# Patient Record
Sex: Male | Born: 1960
Health system: Southern US, Community
[De-identification: ages and names within clinical notes are randomized; demographics above are authoritative.]

## PROBLEM LIST (undated history)

## (undated) DIAGNOSIS — R51 Headache: Secondary | ICD-10-CM

## (undated) DIAGNOSIS — K219 Gastro-esophageal reflux disease without esophagitis: Secondary | ICD-10-CM

## (undated) DIAGNOSIS — Z8489 Family history of other specified conditions: Secondary | ICD-10-CM

## (undated) DIAGNOSIS — N200 Calculus of kidney: Secondary | ICD-10-CM

## (undated) HISTORY — PX: NO PAST SURGERIES: SHX2092

---

## 2000-05-27 ENCOUNTER — Emergency Department (HOSPITAL_COMMUNITY): Admission: EM | Admit: 2000-05-27 | Discharge: 2000-05-27 | Payer: Self-pay | Admitting: *Deleted

## 2001-01-16 ENCOUNTER — Encounter: Payer: Self-pay | Admitting: Emergency Medicine

## 2001-01-16 ENCOUNTER — Emergency Department (HOSPITAL_COMMUNITY): Admission: EM | Admit: 2001-01-16 | Discharge: 2001-01-16 | Payer: Self-pay | Admitting: Emergency Medicine

## 2001-02-17 ENCOUNTER — Encounter: Payer: Self-pay | Admitting: Emergency Medicine

## 2001-02-17 ENCOUNTER — Emergency Department (HOSPITAL_COMMUNITY): Admission: EM | Admit: 2001-02-17 | Discharge: 2001-02-17 | Payer: Self-pay | Admitting: Emergency Medicine

## 2001-03-21 ENCOUNTER — Encounter: Payer: Self-pay | Admitting: Emergency Medicine

## 2001-03-21 ENCOUNTER — Emergency Department (HOSPITAL_COMMUNITY): Admission: EM | Admit: 2001-03-21 | Discharge: 2001-03-21 | Payer: Self-pay | Admitting: Emergency Medicine

## 2002-09-28 ENCOUNTER — Emergency Department (HOSPITAL_COMMUNITY): Admission: EM | Admit: 2002-09-28 | Discharge: 2002-09-28 | Payer: Self-pay | Admitting: Emergency Medicine

## 2003-07-31 ENCOUNTER — Encounter: Payer: Self-pay | Admitting: Emergency Medicine

## 2003-07-31 ENCOUNTER — Emergency Department (HOSPITAL_COMMUNITY): Admission: EM | Admit: 2003-07-31 | Discharge: 2003-07-31 | Payer: Self-pay | Admitting: Emergency Medicine

## 2008-12-15 ENCOUNTER — Emergency Department (HOSPITAL_COMMUNITY): Admission: EM | Admit: 2008-12-15 | Discharge: 2008-12-15 | Payer: Self-pay | Admitting: Emergency Medicine

## 2009-06-28 ENCOUNTER — Emergency Department (HOSPITAL_COMMUNITY): Admission: EM | Admit: 2009-06-28 | Discharge: 2009-06-28 | Payer: Self-pay | Admitting: Emergency Medicine

## 2011-03-31 LAB — HEPATIC FUNCTION PANEL
AST: 25 U/L (ref 0–37)
Bilirubin, Direct: <0.1 mg/dL (ref 0.0–0.3)

## 2011-03-31 LAB — DIFFERENTIAL
Basophils Absolute: 0 10*3/uL (ref 0.0–0.1)
Basophils Relative: 0 % (ref 0–1)
Monocytes Absolute: 0.6 10*3/uL (ref 0.1–1.0)
Neutro Abs: 3.2 10*3/uL (ref 1.7–7.7)

## 2011-03-31 LAB — CBC
Hemoglobin: 14.8 g/dL (ref 13.0–17.0)
MCHC: 34.3 g/dL (ref 30.0–36.0)
RDW: 14.5 % (ref 11.5–15.5)

## 2011-03-31 LAB — URINALYSIS, ROUTINE W REFLEX MICROSCOPIC
Glucose, UA: NEGATIVE mg/dL
Leukocytes, UA: NEGATIVE
Nitrite: NEGATIVE
Protein, ur: NEGATIVE mg/dL
Urobilinogen, UA: 1 mg/dL (ref 0.0–1.0)

## 2011-03-31 LAB — POCT I-STAT, CHEM 8
Calcium, Ion: 1.09 mmol/L — ABNORMAL LOW (ref 1.12–1.32)
Glucose, Bld: 116 mg/dL — ABNORMAL HIGH (ref 70–99)
HCT: 46 % (ref 39.0–52.0)
Hemoglobin: 15.6 g/dL (ref 13.0–17.0)
TCO2: 25 mmol/L (ref 0–100)

## 2011-03-31 LAB — URINE MICROSCOPIC-ADD ON

## 2011-09-26 LAB — DIFFERENTIAL
Eosinophils Absolute: 0 10*3/uL (ref 0.0–0.7)
Eosinophils Relative: 0 % (ref 0–5)
Lymphocytes Relative: 12 % (ref 12–46)
Lymphs Abs: 1.2 10*3/uL (ref 0.7–4.0)
Monocytes Relative: 2 % — ABNORMAL LOW (ref 3–12)
Neutrophils Relative %: 85 % — ABNORMAL HIGH (ref 43–77)

## 2011-09-26 LAB — COMPREHENSIVE METABOLIC PANEL
ALT: 26 U/L (ref 0–53)
AST: 31 U/L (ref 0–37)
Calcium: 9.5 mg/dL (ref 8.4–10.5)
GFR calc Af Amer: >60 mL/min (ref >60)
Sodium: 141 mEq/L (ref 135–145)
Total Protein: 7.9 g/dL (ref 6.0–8.3)

## 2011-09-26 LAB — URINE MICROSCOPIC-ADD ON

## 2011-09-26 LAB — URINALYSIS, ROUTINE W REFLEX MICROSCOPIC
Glucose, UA: NEGATIVE mg/dL
Ketones, ur: 40 mg/dL — AB
Leukocytes, UA: NEGATIVE
Specific Gravity, Urine: 1.023 (ref 1.005–1.030)
pH: 8.5 — ABNORMAL HIGH (ref 5.0–8.0)

## 2011-09-26 LAB — CBC
MCHC: 33.2 g/dL (ref 30.0–36.0)
RDW: 13 % (ref 11.5–15.5)

## 2012-04-07 ENCOUNTER — Encounter (HOSPITAL_COMMUNITY): Payer: Self-pay | Admitting: Emergency Medicine

## 2012-04-07 ENCOUNTER — Emergency Department (HOSPITAL_COMMUNITY)
Admission: EM | Admit: 2012-04-07 | Discharge: 2012-04-08 | Disposition: A | Discharge status: Home / Self Care | Payer: Self-pay | Attending: Emergency Medicine | Admitting: Emergency Medicine

## 2012-04-07 DIAGNOSIS — E876 Hypokalemia: Secondary | ICD-10-CM | POA: Insufficient documentation

## 2012-04-07 DIAGNOSIS — R112 Nausea with vomiting, unspecified: Secondary | ICD-10-CM | POA: Insufficient documentation

## 2012-04-07 DIAGNOSIS — K429 Umbilical hernia without obstruction or gangrene: Secondary | ICD-10-CM | POA: Insufficient documentation

## 2012-04-07 DIAGNOSIS — R197 Diarrhea, unspecified: Secondary | ICD-10-CM | POA: Insufficient documentation

## 2012-04-07 NOTE — ED Notes (Signed)
PT. REPORTS EMESIS WITH DIARRHEA AND MID ABDOMINAL PAIN ONSET THIS EVENING ,.

## 2012-04-08 ENCOUNTER — Emergency Department (HOSPITAL_COMMUNITY): Payer: Self-pay

## 2012-04-08 LAB — POCT I-STAT, CHEM 8
Chloride: 113 mEq/L — ABNORMAL HIGH (ref 96–112)
Creatinine, Ser: 1.1 mg/dL (ref 0.50–1.35)
Glucose, Bld: 139 mg/dL — ABNORMAL HIGH (ref 70–99)
HCT: 41 % (ref 39.0–52.0)
Hemoglobin: 13.9 g/dL (ref 13.0–17.0)
Potassium: 2.8 mEq/L — ABNORMAL LOW (ref 3.5–5.1)
Sodium: 152 mEq/L — ABNORMAL HIGH (ref 135–145)

## 2012-04-08 LAB — CBC
Hemoglobin: 13.6 g/dL (ref 13.0–17.0)
MCH: 31.3 pg (ref 26.0–34.0)
MCHC: 34.3 g/dL (ref 30.0–36.0)
Platelets: 208 10*3/uL (ref 150–400)
RDW: 13.1 % (ref 11.5–15.5)

## 2012-04-08 LAB — URINALYSIS, ROUTINE W REFLEX MICROSCOPIC
Bilirubin Urine: NEGATIVE
Specific Gravity, Urine: 1.042 — ABNORMAL HIGH (ref 1.005–1.030)
Urobilinogen, UA: 1 mg/dL (ref 0.0–1.0)
pH: 7.5 (ref 5.0–8.0)

## 2012-04-08 LAB — COMPREHENSIVE METABOLIC PANEL
ALT: 24 U/L (ref 0–53)
AST: 39 U/L — ABNORMAL HIGH (ref 0–37)
Albumin: 4.4 g/dL (ref 3.5–5.2)
Alkaline Phosphatase: 71 U/L (ref 39–117)
Calcium: 9.9 mg/dL (ref 8.4–10.5)
Potassium: 2.8 mEq/L — ABNORMAL LOW (ref 3.5–5.1)
Sodium: 148 mEq/L — ABNORMAL HIGH (ref 135–145)
Total Protein: 7.5 g/dL (ref 6.0–8.3)

## 2012-04-08 LAB — URINE MICROSCOPIC-ADD ON

## 2012-04-08 MED ORDER — ONDANSETRON HCL 4 MG/2ML IJ SOLN
INTRAMUSCULAR | Status: AC
  Administered 2012-04-08: 4 mg via INTRAVENOUS
  Filled 2012-04-08: qty 2

## 2012-04-08 MED ORDER — HYDROCODONE-ACETAMINOPHEN 5-500 MG PO TABS: 1.0000 | ORAL_TABLET | Freq: Four times a day (QID) | ORAL | Status: AC | PRN

## 2012-04-08 MED ORDER — POTASSIUM CHLORIDE CRYS ER 20 MEQ PO TBCR
40.0000 meq | EXTENDED_RELEASE_TABLET | Freq: Once | ORAL | Status: AC
  Administered 2012-04-08: 40 meq via ORAL
  Filled 2012-04-08: qty 2

## 2012-04-08 MED ORDER — HYDROMORPHONE HCL PF 1 MG/ML IJ SOLN
1.0000 mg | Freq: Once | INTRAMUSCULAR | Status: AC
  Administered 2012-04-08: 1 mg via INTRAVENOUS
  Filled 2012-04-08: qty 1

## 2012-04-08 MED ORDER — FAMOTIDINE 20 MG PO TABS
20.0000 mg | ORAL_TABLET | Freq: Once | ORAL | Status: AC
  Administered 2012-04-08: 20 mg via ORAL
  Filled 2012-04-08: qty 1

## 2012-04-08 MED ORDER — POTASSIUM CHLORIDE 10 MEQ/100ML IV SOLN
10.0000 meq | Freq: Once | INTRAVENOUS | Status: AC
  Administered 2012-04-08: 10 meq via INTRAVENOUS
  Filled 2012-04-08: qty 100

## 2012-04-08 MED ORDER — IOHEXOL 300 MG/ML  SOLN
100.0000 mL | Freq: Once | INTRAMUSCULAR | Status: AC | PRN
  Administered 2012-04-08: 100 mL via INTRAVENOUS

## 2012-04-08 MED ORDER — SODIUM CHLORIDE 0.9 % IV BOLUS (SEPSIS)
1000.0000 mL | Freq: Once | INTRAVENOUS | Status: AC
  Administered 2012-04-08: 1000 mL via INTRAVENOUS

## 2012-04-08 MED ORDER — ONDANSETRON HCL 4 MG/2ML IJ SOLN
4.0000 mg | Freq: Once | INTRAMUSCULAR | Status: AC
  Administered 2012-04-08: 4 mg via INTRAVENOUS
  Filled 2012-04-08: qty 2

## 2012-04-08 MED ORDER — PANTOPRAZOLE SODIUM 40 MG PO TBEC
40.0000 mg | DELAYED_RELEASE_TABLET | Freq: Once | ORAL | Status: AC
  Administered 2012-04-08: 40 mg via ORAL
  Filled 2012-04-08: qty 1

## 2012-04-08 MED ORDER — FAMOTIDINE 20 MG PO TABS: 20.0000 mg | ORAL_TABLET | Freq: Two times a day (BID) | ORAL | Status: DC

## 2012-04-08 MED ORDER — ONDANSETRON HCL 4 MG PO TABS: 4.0000 mg | ORAL_TABLET | Freq: Four times a day (QID) | ORAL | Status: AC

## 2012-04-08 MED ORDER — MORPHINE SULFATE 4 MG/ML IJ SOLN
4.0000 mg | Freq: Once | INTRAMUSCULAR | Status: AC
  Administered 2012-04-08: 4 mg via INTRAVENOUS
  Filled 2012-04-08: qty 1

## 2012-04-08 MED ORDER — HYDROMORPHONE HCL PF 1 MG/ML IJ SOLN
1.0000 mg | Freq: Once | INTRAMUSCULAR | Status: DC
  Filled 2012-04-08: qty 1

## 2012-04-08 MED ORDER — IOHEXOL 300 MG/ML  SOLN
20.0000 mL | INTRAMUSCULAR | Status: AC
  Administered 2012-04-08 (×2): 20 mL via ORAL

## 2012-04-08 NOTE — ED Notes (Signed)
Patient with nausea, vomiting and diarrhea since yesterday.  Patient states he also having abdominal pain.  Patient unable to sit still on bed due to pain.  Patient is CAOx3, mother at bedside.

## 2012-04-08 NOTE — Discharge Instructions (Signed)
Abdominal Pain Abdominal pain can be caused by many things. Your caregiver decides the seriousness of your pain by an examination and possibly blood tests and X-rays. Many cases can be observed and treated at home. Most abdominal pain is not caused by a disease and will probably improve without treatment. However, in many cases, more time must pass before a clear cause of the pain can be found. Before that point, it may not be known if you need more testing, or if hospitalization or surgery is needed. HOME CARE INSTRUCTIONS   Do not take laxatives unless directed by your caregiver.   Take pain medicine only as directed by your caregiver.   Only take over-the-counter or prescription medicines for pain, discomfort, or fever as directed by your caregiver.   Try a clear liquid diet (broth, tea, or water) for as long as directed by your caregiver. Slowly move to a bland diet as tolerated.  SEEK IMMEDIATE MEDICAL CARE IF:   The pain does not go away.   You have a fever.   You keep throwing up (vomiting).   The pain is felt only in portions of the abdomen. Pain in the right side could possibly be appendicitis. In an adult, pain in the left lower portion of the abdomen could be colitis or diverticulitis.   You pass bloody or black tarry stools.  MAKE SURE YOU:   Understand these instructions.   Will watch your condition.   

## 2012-04-08 NOTE — ED Provider Notes (Signed)
History     CSN: [id number]  Arrival date & time 04/07/12  2047   First MD Initiated Contact with Patient 04/08/12 0011      Chief Complaint  Patient presents with  . Emesis    (Consider location/radiation/quality/duration/timing/severity/associated sxs/prior treatment) Patient is a 51 y.o. male presenting with vomiting. The history is provided by the patient.  Emesis  This is a new problem. Associated symptoms include abdominal pain and diarrhea. Pertinent negatives include no chills, no fever and no headaches.   onset today of mid abdominal pain with associated nausea vomiting and diarrhea. Pain is sharp in quality and severe. Pain is not radiating. No right-sided abdominal pain and does not seem to be associated food. Patient states history of gallstones and does not feel anything like that. No fevers or chills. No known sick contacts. Patient works in Aeronautical [employer] and denies any known exposures or bad food exposures. No blood in stool or emesis. No history of peptic ulcer disease. No known aggravating or alleviating factors. Patient unable to hold still due to severe pains. No hematuria. No dysuria or difficulty urinating. No recent travel or antibiotics.  History reviewed. No pertinent past medical history.  History reviewed. No pertinent past surgical history.  No family history on file.  History  Substance Use Topics  . Smoking status: Never Smoker   . Smokeless tobacco: Not on file  . Alcohol Use: No      Review of Systems  Constitutional: Negative for fever and chills.  HENT: Negative for neck pain and neck stiffness.   Eyes: Negative for pain.  Respiratory: Negative for shortness of breath.   Cardiovascular: Negative for chest pain.  Gastrointestinal: Positive for nausea, vomiting, abdominal pain and diarrhea.  Genitourinary: Negative for dysuria.  Musculoskeletal: Negative for back pain.  Skin: Negative for rash.  Neurological: Negative for headaches.  All  other systems reviewed and are negative.    Allergies  Review of patient's allergies indicates no known allergies.  Home Medications   Current Outpatient Rx  Name Route Sig Dispense Refill  . FAMOTIDINE 40 MG PO TABS Oral Take 40 mg by mouth daily.    Marland Kitchen FAMOTIDINE 20 MG PO TABS Oral Take 1 tablet (20 mg total) by mouth 2 (two) times daily. 30 tablet 0  . HYDROCODONE-ACETAMINOPHEN 5-500 MG PO TABS Oral Take 1-2 tablets by mouth every 6 (six) hours as needed for pain. 15 tablet 0  . ONDANSETRON HCL 4 MG PO TABS Oral Take 1 tablet (4 mg total) by mouth every 6 (six) hours. 12 tablet 0    BP 117/69  Pulse 81  Temp(Src) 98.1 F (36.7 C) (Oral)  Resp 20  SpO2 97%  Physical Exam  Constitutional: He is oriented to person, place, and time. He appears well-developed and well-nourished.  HENT:  Head: Normocephalic and atraumatic.  Eyes: Conjunctivae and EOM are normal. Pupils are equal, round, and reactive to light.  Neck: Trachea normal. Neck supple. No thyromegaly present.  Cardiovascular: Normal rate, regular rhythm, S1 normal, S2 normal and normal pulses.     No systolic murmur is present   No diastolic murmur is present  Pulses:      Radial pulses are 2+ on the right side, and 2+ on the left side.  Pulmonary/Chest: Effort normal and breath sounds normal. He has no wheezes. He has no rhonchi. He has no rales. He exhibits no tenderness.  Abdominal: Soft. Normal appearance and bowel sounds are normal. He exhibits no distension. There  is no rebound, no guarding, no CVA tenderness and negative Murphy's sign.       Tender epigastric and midabdomen. No ecchymosis or discoloration  Musculoskeletal:       BLE:s Calves nontender, no cords or erythema, negative Homans sign  Neurological: He is alert and oriented to person, place, and time. He has normal strength. No cranial nerve deficit or sensory deficit. GCS eye subscore is 4. GCS verbal subscore is 5. GCS motor subscore is 6.  Skin: Skin  is warm and dry. No rash noted. He is not diaphoretic.  Psychiatric: His speech is normal.       Cooperative and appropriate    ED Course  Procedures (including critical care time)  Labs Reviewed  CBC - Abnormal; Notable for the following:    WBC 11.1 (*)    All other components within normal limits  COMPREHENSIVE METABOLIC PANEL - Abnormal; Notable for the following:    Sodium 148 (*)    Potassium 2.8 (*)    Glucose, Bld 130 (*)    AST 39 (*)    GFR calc non Af Amer 81 (*)    All other components within normal limits  URINALYSIS, ROUTINE W REFLEX MICROSCOPIC - Abnormal; Notable for the following:    Specific Gravity, Urine 1.042 (*)    Hgb urine dipstick TRACE (*)    Ketones, ur >80 (*)    All other components within normal limits  POCT I-STAT, CHEM 8 - Abnormal; Notable for the following:    Sodium 152 (*)    Potassium 2.8 (*)    Chloride 113 (*)    Glucose, Bld 139 (*)    All other components within normal limits  URINE MICROSCOPIC-ADD ON - Abnormal; Notable for the following:    Squamous Epithelial / LPF FEW (*)    Casts HYALINE CASTS (*)    All other components within normal limits  LIPASE, BLOOD  STOOL CULTURE   Ct Abdomen Pelvis W Contrast  04/08/2012  *RADIOLOGY REPORT*  Clinical Data: Mid to lower abdominal pain, nausea, vomiting and diarrhea.  CT ABDOMEN AND PELVIS WITH CONTRAST  Technique:  Multidetector CT imaging of the abdomen and pelvis was performed following the standard protocol during bolus administration of intravenous contrast.  Contrast: OMNIPAQUE IOHEXOL 300 MG/ML  SOLN  Comparison: CT of the abdomen and pelvis performed 06/28/2009  Findings: Minimal right basilar atelectasis is noted.  A small hypoattenuating lesion within the posterior hepatic dome has decreased mildly in size from 2010, and is likely benign.  A few tiny hypodensities within the liver likely reflect a tiny cysts, though not well characterized.  The liver is otherwise unremarkable  in appearance.  The gallbladder is within normal limits.  The pancreas and adrenal glands are unremarkable.  A small 0.7 cm hypodensity within the interpole region of the right kidney likely reflects a small cyst.  The kidneys are otherwise unremarkable in appearance.  There is no evidence of hydronephrosis.  No renal or ureteral stones are seen.  No perinephric stranding is appreciated.  No free fluid is identified.  The small bowel is unremarkable in appearance.  The stomach is within normal limits.  No acute vascular abnormalities are seen.  A small umbilical hernia is noted, containing only fat.  The appendix is normal in caliber and contains air, extending into the right hemipelvis, without evidence for appendicitis.  The colon is unremarkable in appearance.  The bladder is mildly distended and grossly unremarkable in appearance.  The prostate remains normal in size.  No inguinal lymphadenopathy is seen.  No acute osseous abnormalities are identified.  IMPRESSION:  1.  No acute abnormalities seen within the abdomen or pelvis. 2.  Tiny renal and hepatic cysts noted; mildly hyperattenuating focus within the right hepatic lobe has decreased in size since 2010, and is likely benign. 3.  Small umbilical hernia, containing only fat.  Original Report Authenticated By: Tonia Ghent, M.D.     1. Abdominal pain   2. Nausea vomiting and diarrhea   3. Hypokalemia    IV fluids. IV Dilaudid. IV Zofran. Serial evaluations and repeat medications. CT scan ordered for severe pain and reviewed as above. Labs obtained and reviewed as above. Potassium provide.  Recheck at 4:45 AM is feeling completely better. Nausea resolved and no vomiting in the ED. No further diarrhea. On exam abdomen is soft nontender. Patient has had some mild heartburn and Pepcid Protonix provided.  MDM   Abdominal pain with nausea vomiting and diarrhea likely gastroenteritis. GI referral as needed. Reliable historian and verbalizes  understanding all discharge and followup instructions and agrees to abdominal pain precautions. Prescription for Zofran Pepcid and hydrocodone provided as needed       Sunnie Nielsen, MD 04/08/12 [phone]

## 2012-04-08 NOTE — ED Notes (Signed)
Patient continues with nausea and pain.  MD notified.

## 2012-12-28 ENCOUNTER — Encounter (HOSPITAL_COMMUNITY): Payer: Self-pay | Admitting: *Deleted

## 2012-12-28 ENCOUNTER — Emergency Department (HOSPITAL_COMMUNITY)
Admission: EM | Admit: 2012-12-28 | Discharge: 2012-12-29 | Disposition: A | Discharge status: Home / Self Care | Payer: Self-pay | Attending: Emergency Medicine | Admitting: Emergency Medicine

## 2012-12-28 DIAGNOSIS — R112 Nausea with vomiting, unspecified: Secondary | ICD-10-CM | POA: Insufficient documentation

## 2012-12-28 DIAGNOSIS — R197 Diarrhea, unspecified: Secondary | ICD-10-CM | POA: Insufficient documentation

## 2012-12-28 DIAGNOSIS — R109 Unspecified abdominal pain: Secondary | ICD-10-CM | POA: Insufficient documentation

## 2012-12-28 DIAGNOSIS — Z79899 Other long term (current) drug therapy: Secondary | ICD-10-CM | POA: Insufficient documentation

## 2012-12-28 LAB — CBC WITH DIFFERENTIAL/PLATELET
Basophils Relative: 0 % (ref 0–1)
Basophils Relative: 0 % (ref 0–1)
Eosinophils Absolute: 0.1 10*3/uL (ref 0.0–0.7)
HCT: 43.6 % (ref 39.0–52.0)
Hemoglobin: 15.3 g/dL (ref 13.0–17.0)
Lymphs Abs: 1 10*3/uL (ref 0.7–4.0)
MCH: 31.5 pg (ref 26.0–34.0)
MCHC: 34.9 g/dL (ref 30.0–36.0)
MCHC: 35.1 g/dL (ref 30.0–36.0)
Monocytes Absolute: 0.8 10*3/uL (ref 0.1–1.0)
Monocytes Relative: 5 % (ref 3–12)
Neutro Abs: 13.3 10*3/uL — ABNORMAL HIGH (ref 1.7–7.7)
Neutrophils Relative %: 76 % (ref 43–77)
Platelets: 230 10*3/uL (ref 150–400)
RBC: 4.88 MIL/uL (ref 4.22–5.81)

## 2012-12-28 LAB — COMPREHENSIVE METABOLIC PANEL
ALT: 16 U/L (ref 0–53)
Albumin: 4.3 g/dL (ref 3.5–5.2)
Albumin: 4.1 g/dL (ref 3.5–5.2)
Alkaline Phosphatase: 64 U/L (ref 39–117)
Alkaline Phosphatase: 59 U/L (ref 39–117)
BUN: 12 mg/dL (ref 6–23)
BUN: 13 mg/dL (ref 6–23)
CO2: 21 mEq/L (ref 19–32)
Calcium: 9.9 mg/dL (ref 8.4–10.5)
Chloride: 106 mEq/L (ref 96–112)
Creatinine, Ser: 0.98 mg/dL (ref 0.50–1.35)
GFR calc Af Amer: >90 mL/min (ref >90)
GFR calc non Af Amer: >90 mL/min (ref >90)
Glucose, Bld: 132 mg/dL — ABNORMAL HIGH (ref 70–99)
Potassium: 3.4 mEq/L — ABNORMAL LOW (ref 3.5–5.1)
Potassium: 3.3 mEq/L — ABNORMAL LOW (ref 3.5–5.1)
Sodium: 144 mEq/L (ref 135–145)
Total Bilirubin: 0.7 mg/dL (ref 0.3–1.2)
Total Protein: 8 g/dL (ref 6.0–8.3)

## 2012-12-28 LAB — LIPASE, BLOOD: Lipase: 25 U/L (ref 11–59)

## 2012-12-28 MED ORDER — ONDANSETRON 4 MG PO TBDP
8.0000 mg | ORAL_TABLET | Freq: Once | ORAL | Status: AC
  Administered 2012-12-28: 8 mg via ORAL
  Filled 2012-12-28: qty 2

## 2012-12-28 MED ORDER — MORPHINE SULFATE 4 MG/ML IJ SOLN
6.0000 mg | Freq: Once | INTRAMUSCULAR | Status: AC
  Administered 2012-12-28: 6 mg via INTRAVENOUS
  Filled 2012-12-28: qty 2

## 2012-12-28 MED ORDER — ONDANSETRON HCL 4 MG/2ML IJ SOLN
4.0000 mg | Freq: Once | INTRAMUSCULAR | Status: AC
  Administered 2012-12-28: 4 mg via INTRAVENOUS
  Filled 2012-12-28: qty 2

## 2012-12-28 MED ORDER — SODIUM CHLORIDE 0.9 % IV BOLUS (SEPSIS)
1000.0000 mL | Freq: Once | INTRAVENOUS | Status: AC
  Administered 2012-12-28: 1000 mL via INTRAVENOUS

## 2012-12-28 MED ORDER — FAMOTIDINE IN NACL 20-0.9 MG/50ML-% IV SOLN
20.0000 mg | Freq: Once | INTRAVENOUS | Status: AC
  Administered 2012-12-28: 20 mg via INTRAVENOUS
  Filled 2012-12-28: qty 50

## 2012-12-28 NOTE — ED Notes (Signed)
Patient has been vomiting gastric fluids since arrival to room. He has 10/10 abdominal pain. States symptoms were sudden onset around 6pm and he has had diarrhea as well.

## 2012-12-28 NOTE — ED Notes (Signed)
Pt states that he started vomiting 2 hours ago. Pt states that he has vomited 10 times in the last hour and that he has not eaten anything today. Pt states that he is also having abdominal pain as well. Pt states generalized and sharp. Pt denies diarrhea. Pt moaning, unable to sit still in triage. Pt vomited once in triage.

## 2012-12-28 NOTE — ED Provider Notes (Signed)
History     CSN: [id number]  Arrival date & time 12/28/12  9147   First MD Initiated Contact with Patient 12/28/12 2204      Chief Complaint  Patient presents with  . Emesis    (Consider location/radiation/quality/duration/timing/severity/associated sxs/prior treatment) The history is provided by the patient.    Noah Avila is a 52 y.o. male with acute onset of nausea, vomiting, diarrhea and abdominal pain earlier today. Patient denies fever, change in urination.   History reviewed. No pertinent past medical history.  History reviewed. No pertinent past surgical history.  History reviewed. No pertinent family history.  History  Substance Use Topics  . Smoking status: Never Smoker   . Smokeless tobacco: Not on file  . Alcohol Use: No      Review of Systems  Constitutional: Negative for fever.  Respiratory: Negative for shortness of breath.   Cardiovascular: Negative for chest pain.  Gastrointestinal: Positive for nausea, vomiting and abdominal pain. Negative for diarrhea.  All other systems reviewed and are negative.    Allergies  Review of patient's allergies indicates no known allergies.  Home Medications   Current Outpatient Rx  Name  Route  Sig  Dispense  Refill  . FAMOTIDINE 40 MG PO TABS   Oral   Take 40 mg by mouth daily.           BP 141/84  Pulse 78  Temp 97.5 F (36.4 C) (Oral)  Resp 16  SpO2 100%  Physical Exam  Nursing note and vitals reviewed. Constitutional: He is oriented to person, place, and time. He appears well-developed and well-nourished. No distress.       Patient is very dramatic  HENT:  Head: Normocephalic.  Mouth/Throat: Oropharynx is clear and moist.  Eyes: Conjunctivae normal and EOM are normal. Pupils are equal, round, and reactive to light.  Cardiovascular: Normal rate.   Pulmonary/Chest: Effort normal and breath sounds normal. No stridor. No respiratory distress. He has no wheezes. He has no rales. He exhibits  no tenderness.  Abdominal: Soft. Bowel sounds are normal. He exhibits no distension and no mass. There is no tenderness. There is no rebound and no guarding.  Genitourinary:       No CVA tenderness bilaterally  Musculoskeletal: Normal range of motion.  Neurological: He is alert and oriented to person, place, and time.  Psychiatric: He has a normal mood and affect.    ED Course  Procedures (including critical care time)  Labs Reviewed  CBC WITH DIFFERENTIAL - Abnormal; Notable for the following:    WBC 13.5 (*)     Neutro Abs 10.2 (*)     All other components within normal limits  COMPREHENSIVE METABOLIC PANEL - Abnormal; Notable for the following:    Potassium 3.4 (*)     Glucose, Bld 146 (*)     All other components within normal limits  CBC WITH DIFFERENTIAL - Abnormal; Notable for the following:    WBC 15.1 (*)     Neutrophils Relative 88 (*)     Neutro Abs 13.3 (*)     Lymphocytes Relative 7 (*)     All other components within normal limits  COMPREHENSIVE METABOLIC PANEL - Abnormal; Notable for the following:    Potassium 3.3 (*)     Glucose, Bld 132 (*)     All other components within normal limits  LIPASE, BLOOD  URINALYSIS, ROUTINE W REFLEX MICROSCOPIC   No results found.   1. Nausea vomiting and diarrhea  MDM  Likely gastroenteritis. After initial assessment patient calmed down quite a bit and abdominal exam is benign with no tenderness to palpation or peritoneal signs.  Is tolerating by mouth, vital signs are stable and he is amenable to discharge at this time.   Pt verbalized understanding and agrees with care plan. Outpatient follow-up and return precautions given.    New Prescriptions   ONDANSETRON (ZOFRAN) 4 MG TABLET    Take 1 tablet (4 mg total) by mouth every 6 (six) hours.          Wynetta Emery, PA-C 12/29/12 [phone]

## 2012-12-29 LAB — URINALYSIS, ROUTINE W REFLEX MICROSCOPIC
Ketones, ur: 15 mg/dL — AB
Leukocytes, UA: NEGATIVE
Nitrite: NEGATIVE
Protein, ur: NEGATIVE mg/dL
Urobilinogen, UA: 0.2 mg/dL (ref 0.0–1.0)

## 2012-12-29 LAB — URINE MICROSCOPIC-ADD ON

## 2012-12-29 MED ORDER — ONDANSETRON HCL 4 MG PO TABS: 4.0000 mg | ORAL_TABLET | Freq: Four times a day (QID) | ORAL | Status: DC

## 2012-12-29 MED ORDER — ONDANSETRON HCL 4 MG/2ML IJ SOLN
4.0000 mg | Freq: Once | INTRAMUSCULAR | Status: AC
  Administered 2012-12-29: 4 mg via INTRAVENOUS
  Filled 2012-12-29: qty 2

## 2012-12-29 NOTE — ED Notes (Signed)
Patient states he started feeling sick again and vomited once.

## 2012-12-29 NOTE — ED Notes (Signed)
Pt reports taking small sips of drink without any nausea at this time.

## 2012-12-29 NOTE — ED Notes (Signed)
Patient holding down fluids at this time. No other complaints.

## 2012-12-29 NOTE — ED Notes (Signed)
Patient given something to drink.

## 2012-12-30 NOTE — ED Provider Notes (Signed)
Medical screening examination/treatment/procedure(s) were performed by non-physician practitioner and as supervising physician I was immediately available for consultation/collaboration.   Laray Anger, DO 12/30/12 1013

## 2014-07-26 ENCOUNTER — Observation Stay (HOSPITAL_COMMUNITY)
Admission: EM | Admit: 2014-07-26 | Discharge: 2014-07-27 | Disposition: A | Discharge status: Home / Self Care | Payer: No Typology Code available for payment source | Attending: Internal Medicine | Admitting: Internal Medicine

## 2014-07-26 ENCOUNTER — Emergency Department (HOSPITAL_COMMUNITY): Payer: No Typology Code available for payment source

## 2014-07-26 ENCOUNTER — Encounter (HOSPITAL_COMMUNITY): Payer: Self-pay | Admitting: Emergency Medicine

## 2014-07-26 DIAGNOSIS — R55 Syncope and collapse: Principal | ICD-10-CM | POA: Diagnosis present

## 2014-07-26 DIAGNOSIS — K5289 Other specified noninfective gastroenteritis and colitis: Secondary | ICD-10-CM | POA: Insufficient documentation

## 2014-07-26 DIAGNOSIS — R6889 Other general symptoms and signs: Secondary | ICD-10-CM | POA: Diagnosis present

## 2014-07-26 DIAGNOSIS — R109 Unspecified abdominal pain: Secondary | ICD-10-CM | POA: Insufficient documentation

## 2014-07-26 DIAGNOSIS — R112 Nausea with vomiting, unspecified: Secondary | ICD-10-CM | POA: Insufficient documentation

## 2014-07-26 DIAGNOSIS — Z87442 Personal history of urinary calculi: Secondary | ICD-10-CM | POA: Insufficient documentation

## 2014-07-26 DIAGNOSIS — I441 Atrioventricular block, second degree: Secondary | ICD-10-CM | POA: Diagnosis present

## 2014-07-26 DIAGNOSIS — Z79899 Other long term (current) drug therapy: Secondary | ICD-10-CM | POA: Insufficient documentation

## 2014-07-26 DIAGNOSIS — K529 Noninfective gastroenteritis and colitis, unspecified: Secondary | ICD-10-CM | POA: Diagnosis present

## 2014-07-26 DIAGNOSIS — R197 Diarrhea, unspecified: Secondary | ICD-10-CM | POA: Insufficient documentation

## 2014-07-26 HISTORY — DX: Family history of other specified conditions: Z84.89

## 2014-07-26 HISTORY — DX: Headache: R51

## 2014-07-26 HISTORY — DX: Calculus of kidney: N20.0

## 2014-07-26 LAB — URINALYSIS, ROUTINE W REFLEX MICROSCOPIC
BILIRUBIN URINE: NEGATIVE
GLUCOSE, UA: NEGATIVE mg/dL
Ketones, ur: NEGATIVE mg/dL
Leukocytes, UA: NEGATIVE
Nitrite: NEGATIVE
Protein, ur: NEGATIVE mg/dL
SPECIFIC GRAVITY, URINE: 1.018 (ref 1.005–1.030)
UROBILINOGEN UA: 0.2 mg/dL (ref 0.0–1.0)
pH: 8 (ref 5.0–8.0)

## 2014-07-26 LAB — CBC WITH DIFFERENTIAL/PLATELET
Basophils Absolute: 0 10*3/uL (ref 0.0–0.1)
Basophils Relative: 0 % (ref 0–1)
EOS ABS: 0.2 10*3/uL (ref 0.0–0.7)
Eosinophils Relative: 2 % (ref 0–5)
HCT: 43.8 % (ref 39.0–52.0)
Hemoglobin: 14.7 g/dL (ref 13.0–17.0)
LYMPHS ABS: 3.2 10*3/uL (ref 0.7–4.0)
Lymphocytes Relative: 35 % (ref 12–46)
MCH: 31.3 pg (ref 26.0–34.0)
MCHC: 33.6 g/dL (ref 30.0–36.0)
MCV: 93.4 fL (ref 78.0–100.0)
Monocytes Absolute: 0.4 10*3/uL (ref 0.1–1.0)
Monocytes Relative: 5 % (ref 3–12)
NEUTROS PCT: 58 % (ref 43–77)
Neutro Abs: 5.3 10*3/uL (ref 1.7–7.7)
PLATELETS: 206 10*3/uL (ref 150–400)
RBC: 4.69 MIL/uL (ref 4.22–5.81)
RDW: 12.6 % (ref 11.5–15.5)
WBC: 9.2 10*3/uL (ref 4.0–10.5)

## 2014-07-26 LAB — RAPID URINE DRUG SCREEN, HOSP PERFORMED
Amphetamines: NOT DETECTED
Barbiturates: POSITIVE — AB
Benzodiazepines: NOT DETECTED
Cocaine: NOT DETECTED
OPIATES: POSITIVE — AB
TETRAHYDROCANNABINOL: POSITIVE — AB

## 2014-07-26 LAB — COMPREHENSIVE METABOLIC PANEL
ALT: 22 U/L (ref 0–53)
ANION GAP: 17 — AB (ref 5–15)
AST: 28 U/L (ref 0–37)
Albumin: 4.1 g/dL (ref 3.5–5.2)
Alkaline Phosphatase: 62 U/L (ref 39–117)
BUN: 17 mg/dL (ref 6–23)
CALCIUM: 9.4 mg/dL (ref 8.4–10.5)
CO2: 21 meq/L (ref 19–32)
Chloride: 104 mEq/L (ref 96–112)
Creatinine, Ser: 0.99 mg/dL (ref 0.50–1.35)
GFR calc Af Amer: >90 mL/min (ref >90)
Glucose, Bld: 134 mg/dL — ABNORMAL HIGH (ref 70–99)
Potassium: 3.3 mEq/L — ABNORMAL LOW (ref 3.7–5.3)
Sodium: 142 mEq/L (ref 137–147)
TOTAL PROTEIN: 7.6 g/dL (ref 6.0–8.3)
Total Bilirubin: 0.4 mg/dL (ref 0.3–1.2)

## 2014-07-26 LAB — I-STAT TROPONIN, ED: Troponin i, poc: 0 ng/mL (ref 0.00–0.08)

## 2014-07-26 LAB — I-STAT CG4 LACTIC ACID, ED: Lactic Acid, Venous: 2.58 mmol/L — ABNORMAL HIGH (ref 0.5–2.2)

## 2014-07-26 LAB — TROPONIN I: Troponin I: <0.3 ng/mL (ref <0.30)

## 2014-07-26 LAB — TSH: TSH: 0.248 u[IU]/mL — AB (ref 0.350–4.500)

## 2014-07-26 LAB — LIPASE, BLOOD: LIPASE: 33 U/L (ref 11–59)

## 2014-07-26 LAB — URINE MICROSCOPIC-ADD ON

## 2014-07-26 MED ORDER — SODIUM CHLORIDE 0.9 % IV SOLN
INTRAVENOUS | Status: AC
  Administered 2014-07-26 – 2014-07-27 (×2): via INTRAVENOUS

## 2014-07-26 MED ORDER — POTASSIUM CHLORIDE 10 MEQ/100ML IV SOLN
10.0000 meq | INTRAVENOUS | Status: AC
  Administered 2014-07-26 (×4): 10 meq via INTRAVENOUS
  Filled 2014-07-26 (×4): qty 100

## 2014-07-26 MED ORDER — ONDANSETRON HCL 4 MG PO TABS: 4.0000 mg | ORAL_TABLET | Freq: Four times a day (QID) | ORAL | Status: DC | PRN

## 2014-07-26 MED ORDER — ALBUTEROL SULFATE (2.5 MG/3ML) 0.083% IN NEBU: 2.5000 mg | INHALATION_SOLUTION | RESPIRATORY_TRACT | Status: DC | PRN

## 2014-07-26 MED ORDER — SODIUM CHLORIDE 0.9 % IV BOLUS (SEPSIS)
1000.0000 mL | Freq: Once | INTRAVENOUS | Status: AC
  Administered 2014-07-26: 1000 mL via INTRAVENOUS

## 2014-07-26 MED ORDER — MORPHINE SULFATE 4 MG/ML IJ SOLN
4.0000 mg | Freq: Once | INTRAMUSCULAR | Status: AC
  Administered 2014-07-26: 4 mg via INTRAVENOUS
  Filled 2014-07-26: qty 1

## 2014-07-26 MED ORDER — PANTOPRAZOLE SODIUM 40 MG PO TBEC
40.0000 mg | DELAYED_RELEASE_TABLET | Freq: Every day | ORAL | Status: DC
  Administered 2014-07-27: 40 mg via ORAL
  Filled 2014-07-26: qty 1

## 2014-07-26 MED ORDER — ACETAMINOPHEN 325 MG PO TABS: 650.0000 mg | ORAL_TABLET | Freq: Four times a day (QID) | ORAL | Status: DC | PRN

## 2014-07-26 MED ORDER — POTASSIUM CHLORIDE 10 MEQ/100ML IV SOLN: 10.0000 meq | INTRAVENOUS | Status: DC

## 2014-07-26 MED ORDER — IOHEXOL 300 MG/ML  SOLN: 25.0000 mL | Freq: Once | INTRAMUSCULAR | Status: DC | PRN

## 2014-07-26 MED ORDER — ASPIRIN EC 81 MG PO TBEC
81.0000 mg | DELAYED_RELEASE_TABLET | Freq: Every day | ORAL | Status: DC
  Administered 2014-07-27: 81 mg via ORAL
  Filled 2014-07-26: qty 1

## 2014-07-26 MED ORDER — OXYCODONE HCL 5 MG PO TABS
5.0000 mg | ORAL_TABLET | ORAL | Status: DC | PRN
  Administered 2014-07-27: 5 mg via ORAL
  Filled 2014-07-26: qty 1

## 2014-07-26 MED ORDER — SODIUM CHLORIDE 0.9 % IJ SOLN
3.0000 mL | Freq: Two times a day (BID) | INTRAMUSCULAR | Status: DC
  Administered 2014-07-26: 3 mL via INTRAVENOUS

## 2014-07-26 MED ORDER — ASPIRIN EC 325 MG PO TBEC
325.0000 mg | DELAYED_RELEASE_TABLET | Freq: Once | ORAL | Status: AC
  Administered 2014-07-26: 325 mg via ORAL
  Filled 2014-07-26: qty 1

## 2014-07-26 MED ORDER — ENOXAPARIN SODIUM 40 MG/0.4ML ~~LOC~~ SOLN
40.0000 mg | SUBCUTANEOUS | Status: DC
  Administered 2014-07-26: 40 mg via SUBCUTANEOUS
  Filled 2014-07-26 (×2): qty 0.4

## 2014-07-26 MED ORDER — ACETAMINOPHEN 650 MG RE SUPP: 650.0000 mg | Freq: Four times a day (QID) | RECTAL | Status: DC | PRN

## 2014-07-26 MED ORDER — MORPHINE SULFATE 2 MG/ML IJ SOLN
2.0000 mg | INTRAMUSCULAR | Status: DC | PRN
Start: 2014-07-26 — End: 2014-07-27

## 2014-07-26 MED ORDER — GI COCKTAIL ~~LOC~~
30.0000 mL | Freq: Once | ORAL | Status: AC
  Administered 2014-07-26: 30 mL via ORAL
  Filled 2014-07-26: qty 30

## 2014-07-26 MED ORDER — PANTOPRAZOLE SODIUM 40 MG IV SOLR
40.0000 mg | Freq: Once | INTRAVENOUS | Status: AC
  Administered 2014-07-26: 40 mg via INTRAVENOUS
  Filled 2014-07-26: qty 40

## 2014-07-26 MED ORDER — ONDANSETRON HCL 4 MG/2ML IJ SOLN
4.0000 mg | Freq: Once | INTRAMUSCULAR | Status: AC
  Administered 2014-07-26: 4 mg via INTRAVENOUS
  Filled 2014-07-26: qty 2

## 2014-07-26 MED ORDER — IOHEXOL 300 MG/ML  SOLN
80.0000 mL | Freq: Once | INTRAMUSCULAR | Status: AC | PRN
  Administered 2014-07-26: 80 mL via INTRAVENOUS

## 2014-07-26 MED ORDER — ONDANSETRON HCL 4 MG/2ML IJ SOLN: 4.0000 mg | Freq: Four times a day (QID) | INTRAMUSCULAR | Status: DC | PRN

## 2014-07-26 NOTE — ED Notes (Signed)
PT states he cannot take aspiring now due to nausea.

## 2014-07-26 NOTE — ED Provider Notes (Signed)
I saw and evaluated the patient, reviewed the resident's note and I agree with the findings and plan.   EKG Interpretation   Date/Time:  Tuesday July 26 2014 11:09:22 EDT Ventricular Rate:  63 PR Interval:  227 QRS Duration: 79 QT Interval:  423 QTC Calculation: 433 R Axis:   83 Text Interpretation:  Sinus rhythm Ventricular premature complex Prolonged  PR interval ST elev, probable normal early repol pattern No old tracing to  compare artifact present nonspecific ST/T changes present Confirmed by  [facility]  MD, TREY ([id number]) on 07/26/2014 2:27:39 PM      EKG # 2 - sinus rhythm, rate 57, normal axis, elongating PR intervals followed by dropped beats, nonspecific ST/T changes. Compared to prior, now with evidence of 2nd degree HB mobitz 1.    53 yo male presenting with abdominal pain and vomiting.  On exam, nauseated appearance, in moderate distress, normal respiratory effort, normal perfusion, epigastric TTP with voluntary guarding, heart sounds normal with bradycardic rate and frequent dropped beats.  Labs unremarkable, pt felt better after morphine, CT abd/pel negative.  Second EKG was concerning for possible 2nd degree heart block, mobitz type 1.  Plan admit.   Clinical Impression: 1. Near syncope   2. Acute gastroenteritis   3. Wenckebach phenomenon       Candyce ChurnJohn David Alizee Maple III, MD 07/26/14 [phone]

## 2014-07-26 NOTE — ED Notes (Signed)
PT sent from urgent care abdominal pain; diaphoresis; brady at times.

## 2014-07-26 NOTE — ED Notes (Signed)
Portable xray at bedside. Family at and MD talking with mother.

## 2014-07-26 NOTE — ED Notes (Signed)
Admitting MD at bedside.

## 2014-07-26 NOTE — ED Provider Notes (Signed)
CSN: [id number]     Arrival date & time 07/26/14  1106 History   First MD Initiated Contact with Patient 07/26/14 1108     Chief Complaint  Patient presents with  . Abdominal Pain   Noah Avila is a 53 yo AAM who presents with acute onset of abdominal pain beginning this morning. Patient points to his epigastric area. Patient is to have it before but it is unclear as to what it was. He denies any recent alcohol use. He endorses nausea and vomiting nonbloody and also diarrhea all started today. Abdominal pain does not radiate to the back. He endorses that sick contacts include his daughter and daughter's mother he may have bronchitis. Patient presented to urgent care earlier today and was sent here for further evaluation as he became bradycardic in the 50s. Patient has no cardiac history but mother endorses that patient's brother died of cardiomyopathy likely due to viral infection.   He denies CP, SOB, constipation, hematemesis, dysuria, hematuria, sick contacts, or recent travel.   (Consider location/radiation/quality/duration/timing/severity/associated sxs/prior Treatment) Patient is a 53 y.o. male presenting with abdominal pain. The history is provided by the patient.  Abdominal Pain Pain location:  Epigastric Pain quality: aching and sharp   Pain radiates to:  Does not radiate Timing:  Constant Progression:  Unchanged Context: not alcohol use   Relieved by:  Nothing Associated symptoms: diarrhea, nausea and vomiting   Associated symptoms: no chest pain, no chills, no constipation, no dysuria, no fever and no shortness of breath     Past Medical History  Diagnosis Date  . Nephrolithiasis    History reviewed. No pertinent past surgical history. Family History  Problem Relation Age of Onset  . Coronary artery disease Mother   . Lung cancer Mother   . Renal Disease Father   . Diabetes Father   . Heart disease Father   . Heart disease Brother    History  Substance Use  Topics  . Smoking status: Never Smoker   . Smokeless tobacco: Not on file  . Alcohol Use: No    Review of Systems  Unable to perform ROS Constitutional: Negative for fever and chills.  Respiratory: Negative for shortness of breath.   Cardiovascular: Negative for chest pain, palpitations and leg swelling.  Gastrointestinal: Positive for nausea, vomiting and diarrhea. Negative for abdominal pain, constipation and abdominal distention.  Genitourinary: Negative for dysuria, frequency, flank pain and decreased urine volume.  Neurological: Negative for dizziness, speech difficulty, light-headedness and headaches.  All other systems reviewed and are negative.     Allergies  Review of patient's allergies indicates no known allergies.  Home Medications   Prior to Admission medications   Medication Sig Start Date End Date Taking? Authorizing Provider  famotidine (PEPCID) 40 MG tablet Take 40 mg by mouth daily.   Yes Historical Provider, MD   BP 142/75  Pulse 64  Temp(Src) 97.5 F (36.4 C) (Oral)  Resp 23  SpO2 100% Physical Exam  Nursing note and vitals reviewed. Constitutional: He appears well-developed and well-nourished. No distress.  HENT:  Head: Normocephalic and atraumatic.  Eyes: Pupils are equal, round, and reactive to light.  Neck: Normal range of motion.  Cardiovascular: Normal rate, regular rhythm, normal heart sounds and intact distal pulses.  Exam reveals no gallop and no friction rub.   No murmur heard. Pulmonary/Chest: Effort normal and breath sounds normal. No respiratory distress. He has no wheezes. He has no rales. He exhibits no tenderness.  Abdominal: Soft.  Bowel sounds are normal. He exhibits no distension and no mass. There is tenderness (generalized). There is guarding. There is no rebound.  Small umbilical hernia, reducible.  Musculoskeletal: Normal range of motion.  Lymphadenopathy:    He has no cervical adenopathy.  Skin: Skin is warm and dry. He is  not diaphoretic.    ED Course  Procedures (including critical care time) Labs Review Labs Reviewed  COMPREHENSIVE METABOLIC PANEL - Abnormal; Notable for the following:    Potassium 3.3 (*)    Glucose, Bld 134 (*)    Anion gap 17 (*)    All other components within normal limits  URINALYSIS, ROUTINE W REFLEX MICROSCOPIC - Abnormal; Notable for the following:    Hgb urine dipstick TRACE (*)    All other components within normal limits  I-STAT CG4 LACTIC ACID, ED - Abnormal; Notable for the following:    Lactic Acid, Venous 2.58 (*)    All other components within normal limits  CBC WITH DIFFERENTIAL  LIPASE, BLOOD  URINE MICROSCOPIC-ADD ON  URINE RAPID DRUG SCREEN (HOSP PERFORMED)  TSH  TROPONIN I  TROPONIN I  TROPONIN I  COMPREHENSIVE METABOLIC PANEL  CBC  I-STAT TROPOININ, ED    Imaging Review Ct Abdomen Pelvis W Contrast  07/26/2014   CLINICAL DATA:  Mid to low abdominal pain.  EXAM: CT ABDOMEN AND PELVIS WITH CONTRAST  TECHNIQUE: Multidetector CT imaging of the abdomen and pelvis was performed using the standard protocol following bolus administration of intravenous contrast.  CONTRAST:  80mL OMNIPAQUE IOHEXOL 300 MG/ML  SOLN  COMPARISON:  CT abdomen pelvis 04/08/2012.  FINDINGS: A 9 mm hyperdense lesion in the right lobe of the liver is stable. The spleen is within normal limits. Today's study is performed during more arterial phase imaging. The  Stomach, duodenum, and pancreas are within normal limits. Common bile duct and gallbladder are normal. The adrenal glands are normal bilaterally. A 6 mm posterior right renal cyst is again seen. The kidneys and ureters are otherwise unremarkable.  The rectosigmoid colon is within normal limits. The transverse and descending colon are mostly collapsed. The appendix is visualized and normal. Small bowel is within normal limits. Atherosclerotic calcifications are noted in the aorta and branch vessels.  Moderate prominence of the prostate gland  is again noted, somewhat advanced for age. The gland measures 4.5 cm in transverse diameter the does grade some impression on the base of the bladder. The right testicle the somewhat high-riding within the right angle canal compared to prior studies.  The bone windows are unremarkable. Chronic degenerative changes are noted at the L5-S1 disc.  IMPRESSION: 1. 9 mm hyperdense lesion in the right lobe of the liver is stable with the prior exam 2. Stable right renal cyst. 3. No acute or focal lesion to explain the patient's symptoms. 4. Moderate prominence of the prostate gland. 5. High-riding right testicle may be positional. Please correlate with physical exam.   Electronically Signed   By: Gennette Pachris  Mattern M.D.   On: 07/26/2014 13:52   Dg Chest Portable 1 View  07/26/2014   CLINICAL DATA:  Chest and abdominal pain.  EXAM: PORTABLE CHEST - 1 VIEW  COMPARISON:  None.  FINDINGS: The heart size is normal. The lungs are clear. Defibrillator pads are in place. The visualized soft tissues and bony thorax are unremarkable.  IMPRESSION: No active disease.   Electronically Signed   By: Gennette Pachris  Mattern M.D.   On: 07/26/2014 12:07     EKG Interpretation  Date/Time:  Tuesday July 26 2014 11:09:22 EDT Ventricular Rate:  63 PR Interval:  227 QRS Duration: 79 QT Interval:  423 QTC Calculation: 433 R Axis:   83 Text Interpretation:  Sinus rhythm Ventricular premature complex Prolonged  PR interval ST elev, probable normal early repol pattern No old tracing to  compare artifact present nonspecific ST/T changes present Confirmed by  [facility]  MD, TREY ([id number]) on 07/26/2014 2:27:39 PM      MDM   52 YO AAM who presents with abdominal pain. Please see history of present illness for details. On exam, AF VSS. Patient writhing around in pain and diaphoretic. Abd tender diffusely w/mild guarding. Testicles soft, nontender, no lesions. Differential includes pancreatitis, ischemic bowel, cardiac etiology. Doubt dissection as  his pain does not radiate to the back and vitals are stable. Doubt testicular torsion as non-tender. Will obtain EKG, chest x-ray, CT abdomen pelvis, as well as labs including CBC, CMP, lipase, and UA. Given Zofran for nausea. Given GI cocktail and morphine for pain.  Patient's became bradycardic in the 50s again EKG obtained shows Wenckebach. Pacer pads were placed on the patient for his safety. Patient on continuous telemetry. Given bolus of normal saline.  With morphine patient's pain was much improved. Patient had to have an anion gap of 17. Lactic acid only minimally elevated. CT abdomen and pelvis shows no specific etiology for pain he does note a high riding testicle however have very low suspicion for testicular torsion as nontender and pain is improving. UA negative for UTI.   Unclear etiology of abdominal pain. Pt will be admitted to hospital service for continued monitoring of abd pain and arrhythmia. Please see their note for further details regarding his hospital course.  Final diagnoses:  Near syncope  Acute gastroenteritis  Wenckebach phenomenon    Pt was seen under the supervision of Dr. Loretha Stapler.     Rachelle Hora, MD 07/26/14 [phone]

## 2014-07-26 NOTE — ED Notes (Signed)
Patient transported to CT 

## 2014-07-26 NOTE — ED Notes (Signed)
Attempted report 

## 2014-07-26 NOTE — ED Notes (Signed)
Lactic acid results given to Dr. Wofford 

## 2014-07-26 NOTE — H&P (Signed)
[facility] History and Physical  Noah Avila YFV:[id number] DOB: [birth year] DOA: 07/26/2014   PCP: He does not have a primary care physician Specialists: None  Chief Complaint: Diarrhea with nausea and vomiting since this morning  HPI: Noah Avila is a 53 y.o. male with a past medical history of nephrolithiasis many years ago, who was in his usual state of health until this morning at 8:00 when he woke up and started having some discomfort in his abdomen. He had to go to the bathroom and then had a loose stool. He felt weak. He took Prevacid. He went to lay back on his bed. And, then again had to wake up and go to the bathroom and had another episode of loose stool. He has had 4 episodes of diarrhea. Along with all this, he also had nausea and has had multiple episodes of vomiting. Denies any blood in the stool or in the vomit. Denies any fever, but has been having chills. He went to an urgent care center where he continued to vomit. He was diaphoretic. EMS was called and the patient was brought into the emergency department. He was quite diaphoretic when he got here. He was shaking all over. He was sweating profusely. He mentioned that he also had been having abdominal pain in the upper abdomen without any radiation. He is unable to describe the pain. Currently, the pain is much better. His diarrhea and vomiting seem to be improving as well. He also felt dizzy and lightheaded, but denies any syncopal episode, but felt like he was going to. ED physician told me that during this time he was noted to have Wenckebach phenomenon on his telemetry monitor. His heart rate dropped into the 50s, but blood pressure remained stable. Patient denies any chest pain or shortness of breath. 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 He was at a family reunion on weekend. Denies eating out anywhere yesterday. Denies any other family members being sick.  Home  Medications: Prior to Admission medications   Medication Sig Start Date End Date Taking? Authorizing Provider  famotidine (PEPCID) 40 MG tablet Take 40 mg by mouth daily.   Yes Historical Provider, MD    Allergies: No Known Allergies  Past Medical History: Past Medical History  Diagnosis Date  . Nephrolithiasis     Social History: He lives in [place]. He works as a Development worker, international aid. No smoking, alcohol use illicit drug use.  Family History:  Family History  Problem Relation Age of Onset  . Coronary artery disease Mother   . Lung cancer Mother   . Renal Disease Father   . Diabetes Father   . Heart disease Father   . Heart disease Brother      Review of Systems - History obtained from the patient General ROS: positive for  - chills and fatigue Psychological ROS: negative Ophthalmic ROS: negative ENT ROS: negative Allergy and Immunology ROS: negative Hematological and Lymphatic ROS: negative Endocrine ROS: negative Respiratory ROS: no cough, shortness of breath, or wheezing Cardiovascular ROS: no chest pain or dyspnea on exertion Gastrointestinal ROS: as in hpi Genito-Urinary ROS: no dysuria, trouble voiding, or hematuria Musculoskeletal ROS: negative Neurological ROS: no TIA or stroke symptoms Dermatological ROS: negative  Physical Examination  Filed Vitals:   07/26/14 1400 07/26/14 1430 07/26/14 1431 07/26/14 1436  BP: 115/55 109/53 109/53   Pulse: 51 62 62   Temp:    98.4 F (36.9 C)  TempSrc:  Oral  Resp: 20 19 18    SpO2: 99% 98% 99%     BP 109/53  Pulse 62  Temp(Src) 98.4 F (36.9 C) (Oral)  Resp 18  SpO2 99%  General appearance: alert, cooperative, appears stated age and no distress Head: Normocephalic, without obvious abnormality, atraumatic Eyes: conjunctivae/corneas clear. PERRL, EOM's intact.  Throat: lips, mucosa, and tongue normal; teeth and gums normal Neck: no adenopathy, no carotid bruit, no JVD, supple, symmetrical, trachea midline and  thyroid not enlarged, symmetric, no tenderness/mass/nodules Resp: clear to auscultation bilaterally Cardio: regular rate and rhythm, S1, S2 normal, no murmur, click, rub or gallop GI: Abdomen is soft. Minimal discomfort in the epigastric area without any rebound, rigidity, or guarding. No masses, or organomegaly. Bowel sounds are present. Extremities: extremities normal, atraumatic, no cyanosis or edema Pulses: 2+ and symmetric Skin: Skin color, texture, turgor normal. No rashes or lesions Lymph nodes: Cervical, supraclavicular, and axillary nodes normal. Neurologic: Alert and oriented x3. No focal neurological deficits are present.  Laboratory Data: Results for orders placed during the hospital encounter of 07/26/14 (from the past 48 hour(s))  CBC WITH DIFFERENTIAL     Status: None   Collection Time    07/26/14 11:13 AM      Result Value Ref Range   WBC 9.2  4.0 - 10.5 K/uL   RBC 4.69  4.22 - 5.81 MIL/uL   Hemoglobin 14.7  13.0 - 17.0 g/dL   HCT 43.8  39.0 - 52.0 %   MCV 93.4  78.0 - 100.0 fL   MCH 31.3  26.0 - 34.0 pg   MCHC 33.6  30.0 - 36.0 g/dL   RDW 12.6  11.5 - 15.5 %   Platelets 206  150 - 400 K/uL   Neutrophils Relative % 58  43 - 77 %   Neutro Abs 5.3  1.7 - 7.7 K/uL   Lymphocytes Relative 35  12 - 46 %   Lymphs Abs 3.2  0.7 - 4.0 K/uL   Monocytes Relative 5  3 - 12 %   Monocytes Absolute 0.4  0.1 - 1.0 K/uL   Eosinophils Relative 2  0 - 5 %   Eosinophils Absolute 0.2  0.0 - 0.7 K/uL   Basophils Relative 0  0 - 1 %   Basophils Absolute 0.0  0.0 - 0.1 K/uL  COMPREHENSIVE METABOLIC PANEL     Status: Abnormal   Collection Time    07/26/14 11:13 AM      Result Value Ref Range   Sodium 142  137 - 147 mEq/L   Potassium 3.3 (*) 3.7 - 5.3 mEq/L   Chloride 104  96 - 112 mEq/L   CO2 21  19 - 32 mEq/L   Glucose, Bld 134 (*) 70 - 99 mg/dL   BUN 17  6 - 23 mg/dL   Creatinine, Ser 0.99  0.50 - 1.35 mg/dL   Calcium 9.4  8.4 - 10.5 mg/dL   Total Protein 7.6  6.0 - 8.3 g/dL    Albumin 4.1  3.5 - 5.2 g/dL   AST 28  0 - 37 U/L   ALT 22  0 - 53 U/L   Alkaline Phosphatase 62  39 - 117 U/L   Total Bilirubin 0.4  0.3 - 1.2 mg/dL   GFR calc non Af Amer >90  >90 mL/min   GFR calc Af Amer >90  >90 mL/min   Comment: (NOTE)     The eGFR has been calculated using the CKD EPI  equation.     This calculation has not been validated in all clinical situations.     eGFR's persistently <90 mL/min signify possible Chronic Kidney     Disease.   Anion gap 17 (*) 5 - 15  LIPASE, BLOOD     Status: None   Collection Time    07/26/14 11:13 AM      Result Value Ref Range   Lipase 33  11 - 59 U/L  I-STAT TROPOININ, ED     Status: None   Collection Time    07/26/14 11:32 AM      Result Value Ref Range   Troponin i, poc 0.00  0.00 - 0.08 ng/mL   Comment 3            Comment: Due to the release kinetics of cTnI,     a negative result within the first hours     of the onset of symptoms does not rule out     myocardial infarction with certainty.     If myocardial infarction is still suspected,     repeat the test at appropriate intervals.  URINALYSIS, ROUTINE W REFLEX MICROSCOPIC     Status: Abnormal   Collection Time    07/26/14 12:52 PM      Result Value Ref Range   Color, Urine YELLOW  YELLOW   APPearance CLEAR  CLEAR   Specific Gravity, Urine 1.018  1.005 - 1.030   pH 8.0  5.0 - 8.0   Glucose, UA NEGATIVE  NEGATIVE mg/dL   Hgb urine dipstick TRACE (*) NEGATIVE   Bilirubin Urine NEGATIVE  NEGATIVE   Ketones, ur NEGATIVE  NEGATIVE mg/dL   Protein, ur NEGATIVE  NEGATIVE mg/dL   Urobilinogen, UA 0.2  0.0 - 1.0 mg/dL   Nitrite NEGATIVE  NEGATIVE   Leukocytes, UA NEGATIVE  NEGATIVE  URINE MICROSCOPIC-ADD ON     Status: None   Collection Time    07/26/14 12:52 PM      Result Value Ref Range   RBC / HPF 0-2  <3 RBC/hpf  I-STAT CG4 LACTIC ACID, ED     Status: Abnormal   Collection Time    07/26/14  1:28 PM      Result Value Ref Range   Lactic Acid, Venous 2.58 (*) 0.5 -  2.2 mmol/L    Radiology Reports: Ct Abdomen Pelvis W Contrast  07/26/2014   CLINICAL DATA:  Mid to low abdominal pain.  EXAM: CT ABDOMEN AND PELVIS WITH CONTRAST  TECHNIQUE: Multidetector CT imaging of the abdomen and pelvis was performed using the standard protocol following bolus administration of intravenous contrast.  CONTRAST:  96m OMNIPAQUE IOHEXOL 300 MG/ML  SOLN  COMPARISON:  CT abdomen pelvis 04/08/2012.  FINDINGS: A 9 mm hyperdense lesion in the right lobe of the liver is stable. The spleen is within normal limits. Today's study is performed during more arterial phase imaging. The  Stomach, duodenum, and pancreas are within normal limits. Common bile duct and gallbladder are normal. The adrenal glands are normal bilaterally. A 6 mm posterior right renal cyst is again seen. The kidneys and ureters are otherwise unremarkable.  The rectosigmoid colon is within normal limits. The transverse and descending colon are mostly collapsed. The appendix is visualized and normal. Small bowel is within normal limits. Atherosclerotic calcifications are noted in the aorta and branch vessels.  Moderate prominence of the prostate gland is again noted, somewhat advanced for age. The gland measures 4.5 cm in transverse diameter the  does grade some impression on the base of the bladder. The right testicle the somewhat high-riding within the right angle canal compared to prior studies.  The bone windows are unremarkable. Chronic degenerative changes are noted at the L5-S1 disc.  IMPRESSION: 1. 9 mm hyperdense lesion in the right lobe of the liver is stable with the prior exam 2. Stable right renal cyst. 3. No acute or focal lesion to explain the patient's symptoms. 4. Moderate prominence of the prostate gland. 5. High-riding right testicle may be positional. Please correlate with physical exam.   Electronically Signed   By: Noah Avila M.D.   On: 07/26/2014 13:52   Dg Chest Portable 1 View  07/26/2014   CLINICAL DATA:   Chest and abdominal pain.  EXAM: PORTABLE CHEST - 1 VIEW  COMPARISON:  None.  FINDINGS: The heart size is normal. The lungs are clear. Defibrillator pads are in place. The visualized soft tissues and bony thorax are unremarkable.  IMPRESSION: No active disease.   Electronically Signed   By: Noah Avila M.D.   On: 07/26/2014 12:07    Electrocardiogram: Sinus rhythm at 66 beats a minute. Normal axis. First degree AV block. Early repolarization changes noted in V2 V3. No concerning other changes seen. Similar to EKG obtained by EMS earlier today.  Problem List  Principal Problem:   Near syncope Active Problems:   Acute gastroenteritis   Rigors   Wenckebach phenomenon   Assessment: This is a 53 year old, African American male, who presents with nausea vomiting, diarrhea, diaphoresis, weakness, near syncope. Most of his symptoms can be explained by acute gastroenteritis. He was briefly noted to have Wenckebach phenomenon on telemetry monitor per EDP. I could not find this tracing. This could have been a transient phenomena considering his acute GI illness. He denies any current chest pain. His EKG does not show any acute changes. Troponin is normal. His lactic acid is only mildly elevated.  Plan: #1 near syncope with diaphoresis: Most likely due to GI illness. Orthostatics will be checked. We'll give IV fluids. There is significant history of heart disease in his family with a brother who passed away with heart disease at the age of 42. We will proceed with an echocardiogram. Troponins will be obtained. He'll be observed overnight. Check urine drug screen also.  #2 acute gastroenteritis: This be treated symptomatically. IV fluids will be given. CT scan of abdomen has been done and does not show any acute findings, which is reassuring.  #3 mildly elevated lactic acid: Probably due to hypovolemia. We will give him IV fluids. He's afebrile. He has a normal WBC.  #4 transient Wenckebach  phenomenon: Most likely due to acute GI illness. Monitor him on telemetry. Check TSH. Follow up on results of echocardiogram.  DVT Prophylaxis: Lovenox Code Status: Full code Family Communication: Discussed with the patient and his mother  Disposition Plan: Observe to telemetry   Further management decisions will depend on results of further testing and patient's response to treatment.   [facility]  [facility] Pager [phone]  If 7PM-7AM, please contact night-coverage [email or web address] Password [email or web address]  07/26/2014, 3:00 PM  Disclaimer: This note was dictated with voice recognition software. Similar sounding words can inadvertently be transcribed and may not be corrected upon review.

## 2014-07-26 NOTE — ED Notes (Signed)
MD at bedside. 

## 2014-07-26 NOTE — ED Notes (Signed)
PT very diaphoretic upon arrival. MD at bedside.

## 2014-07-26 NOTE — ED Notes (Signed)
Pt placed on pacer pads.

## 2014-07-27 DIAGNOSIS — R9431 Abnormal electrocardiogram [ECG] [EKG]: Secondary | ICD-10-CM

## 2014-07-27 LAB — TROPONIN I: Troponin I: <0.3 ng/mL (ref <0.30)

## 2014-07-27 LAB — CBC
HEMATOCRIT: 37.6 % — AB (ref 39.0–52.0)
Hemoglobin: 12.4 g/dL — ABNORMAL LOW (ref 13.0–17.0)
MCH: 30.4 pg (ref 26.0–34.0)
MCHC: 33 g/dL (ref 30.0–36.0)
MCV: 92.2 fL (ref 78.0–100.0)
Platelets: 184 10*3/uL (ref 150–400)
RBC: 4.08 MIL/uL — AB (ref 4.22–5.81)
RDW: 12.9 % (ref 11.5–15.5)
WBC: 9.6 10*3/uL (ref 4.0–10.5)

## 2014-07-27 LAB — COMPREHENSIVE METABOLIC PANEL
ALBUMIN: 3 g/dL — AB (ref 3.5–5.2)
ALT: 18 U/L (ref 0–53)
AST: 29 U/L (ref 0–37)
Alkaline Phosphatase: 49 U/L (ref 39–117)
Anion gap: 12 (ref 5–15)
BUN: 10 mg/dL (ref 6–23)
CALCIUM: 8.3 mg/dL — AB (ref 8.4–10.5)
CO2: 23 mEq/L (ref 19–32)
Chloride: 109 mEq/L (ref 96–112)
Creatinine, Ser: 1.01 mg/dL (ref 0.50–1.35)
GFR calc Af Amer: >90 mL/min (ref >90)
GFR calc non Af Amer: 85 mL/min — ABNORMAL LOW (ref >90)
Glucose, Bld: 86 mg/dL (ref 70–99)
POTASSIUM: 3.7 meq/L (ref 3.7–5.3)
Sodium: 144 mEq/L (ref 137–147)
TOTAL PROTEIN: 5.9 g/dL — AB (ref 6.0–8.3)
Total Bilirubin: 0.4 mg/dL (ref 0.3–1.2)

## 2014-07-27 MED ORDER — PANTOPRAZOLE SODIUM 40 MG PO TBEC: 40.0000 mg | DELAYED_RELEASE_TABLET | Freq: Every day | ORAL | Status: DC

## 2014-07-27 NOTE — Discharge Summary (Signed)
Physician Discharge Summary  Noah Avila ZOX:[id number] DOB: July 13, 1961 DOA: 07/26/2014  PCP: No PCP Per Patient  Admit date: 07/26/2014 Discharge date: 07/27/2014  Time spent: >35 minutes  Recommendations for Outpatient Follow-up:  F/u with PCP in 1-2 weeks  Discharge Diagnoses:  Principal Problem:   Near syncope Active Problems:   Acute gastroenteritis   Rigors   Wenckebach phenomenon   Discharge Condition: stable   Diet recommendation: regular   Filed Weights   07/26/14 1606  Weight: 68.04 kg (150 lb)    History of present illness:  53 year old, African American male, who presents with nausea vomiting, diarrhea, diaphoresis, weakness, near syncope    Hospital Course:  1. Near syncope with diaphoresis: Most likely due to GI illness/dehydration. Likely due to working outside in warm weather  -neuro exam no focal; no new symptoms; pend echo  -? Related to drug use, urine tox +opioids, cannabis, barbiturate; recommended to stop using drugs  2. Acute gastroenteritis: resolved on IVF, supportive care  3. Mildly elevated lactic acid: Probably due to hypovolemia. He's afebrile. He has a normal WBC.  4. ?transient Wenckebach phenomenon: Most likely due to acute GI illness. Vs drug use;  -no arrythmia; TSH -0.24.; recheck tsh, free t4 as outpatient; Follow up on results of echocardiogram as outpatient       Procedures: Echo-pend  (i.e. Studies not automatically included, echos, thoracentesis, etc; not x-rays)  Consultations:  none  Discharge Exam: Filed Vitals:   07/27/14 1351  BP: 119/57  Pulse: 62  Temp: 98.8 F (37.1 C)  Resp: 17    General: alert Cardiovascular: s1,s2 rrr Respiratory: CTA BL  Discharge Instructions  Discharge Instructions   Diet - low sodium heart healthy    Complete by:  As directed      Discharge instructions    Complete by:  As directed   Please follow up with primary care doctor in 1-2 week s     Increase activity slowly     Complete by:  As directed             Medication List         famotidine 40 MG tablet  Commonly known as:  PEPCID  Take 40 mg by mouth daily.       No Known Allergies     Follow-up Information   Follow up with [facility]    . Schedule an appointment as soon as possible for a visit in 2 weeks.   Contact information:   [address] [place] [address] [phone]       The results of significant diagnostics from this hospitalization (including imaging, microbiology, ancillary and laboratory) are listed below for reference.    Significant Diagnostic Studies: Ct Abdomen Pelvis W Contrast  07/26/2014   CLINICAL DATA:  Mid to low abdominal pain.  EXAM: CT ABDOMEN AND PELVIS WITH CONTRAST  TECHNIQUE: Multidetector CT imaging of the abdomen and pelvis was performed using the standard protocol following bolus administration of intravenous contrast.  CONTRAST:  80mL OMNIPAQUE IOHEXOL 300 MG/ML  SOLN  COMPARISON:  CT abdomen pelvis 04/08/2012.  FINDINGS: A 9 mm hyperdense lesion in the right lobe of the liver is stable. The spleen is within normal limits. Today's study is performed during more arterial phase imaging. The  Stomach, duodenum, and pancreas are within normal limits. Common bile duct and gallbladder are normal. The adrenal glands are normal bilaterally. A 6 mm posterior right renal cyst is again  seen. The kidneys and ureters are otherwise unremarkable.  The rectosigmoid colon is within normal limits. The transverse and descending colon are mostly collapsed. The appendix is visualized and normal. Small bowel is within normal limits. Atherosclerotic calcifications are noted in the aorta and branch vessels.  Moderate prominence of the prostate gland is again noted, somewhat advanced for age. The gland measures 4.5 cm in transverse diameter the does grade some impression on the base of the bladder. The right testicle the somewhat high-riding  within the right angle canal compared to prior studies.  The bone windows are unremarkable. Chronic degenerative changes are noted at the L5-S1 disc.  IMPRESSION: 1. 9 mm hyperdense lesion in the right lobe of the liver is stable with the prior exam 2. Stable right renal cyst. 3. No acute or focal lesion to explain the patient's symptoms. 4. Moderate prominence of the prostate gland. 5. High-riding right testicle may be positional. Please correlate with physical exam.   Electronically Signed   By: Gennette Pachris  Mattern M.D.   On: 07/26/2014 13:52   Dg Chest Portable 1 View  07/26/2014   CLINICAL DATA:  Chest and abdominal pain.  EXAM: PORTABLE CHEST - 1 VIEW  COMPARISON:  None.  FINDINGS: The heart size is normal. The lungs are clear. Defibrillator pads are in place. The visualized soft tissues and bony thorax are unremarkable.  IMPRESSION: No active disease.   Electronically Signed   By: Gennette Pachris  Mattern M.D.   On: 07/26/2014 12:07    Microbiology: No results found for this or any previous visit (from the past 240 hour(s)).   Labs: Basic Metabolic Panel:  Recent Labs Lab 07/26/14 1113 07/27/14 0345  NA 142 144  K 3.3* 3.7  CL 104 109  CO2 21 23  GLUCOSE 134* 86  BUN 17 10  CREATININE 0.99 1.01  CALCIUM 9.4 8.3*   Liver Function Tests:  Recent Labs Lab 07/26/14 1113 07/27/14 0345  AST 28 29  ALT 22 18  ALKPHOS 62 49  BILITOT 0.4 0.4  PROT 7.6 5.9*  ALBUMIN 4.1 3.0*    Recent Labs Lab 07/26/14 1113  LIPASE 33   No results found for this basename: AMMONIA,  in the last 168 hours CBC:  Recent Labs Lab 07/26/14 1113 07/27/14 0345  WBC 9.2 9.6  NEUTROABS 5.3  --   HGB 14.7 12.4*  HCT 43.8 37.6*  MCV 93.4 92.2  PLT 206 184   Cardiac Enzymes:  Recent Labs Lab 07/26/14 1729 07/26/14 2131 07/27/14 0345  TROPONINI <0.30 <0.30 <0.30   BNP: BNP (last 3 results) No results found for this basename: PROBNP,  in the last 8760 hours CBG: No results found for this basename:  GLUCAP,  in the last 168 hours     Signed:  [name], Nakoma Gotwalt N  [facility] 07/27/2014, 3:36 PM

## 2014-07-27 NOTE — Progress Notes (Signed)
  Echocardiogram 2D Echocardiogram has been performed.  Arvil ChacoFoster, Gennett Garcia 07/27/2014, 3:01 PM

## 2014-07-27 NOTE — ED Provider Notes (Signed)
I saw and evaluated the patient, reviewed the resident's note and I agree with the findings and plan.   EKG Interpretation   Date/Time:  Tuesday July 26 2014 11:09:22 EDT Ventricular Rate:  63 PR Interval:  227 QRS Duration: 79 QT Interval:  423 QTC Calculation: 433 R Axis:   83 Text Interpretation:  Sinus rhythm Ventricular premature complex Prolonged  PR interval ST elev, probable normal early repol pattern No old tracing to  compare artifact present nonspecific ST/T changes present Confirmed by  [facility] LLCWOFFORD  MD, TREY ([phone]) on 07/26/2014 2:27:39 PM        Candyce ChurnJohn David Christy Friede III, MD 07/27/14 [phone]

## 2014-07-27 NOTE — Progress Notes (Signed)
[facility] PROGRESS NOTE  Noah Avila UJW:[id number] DOB: 01-22-1961 DOA: 07/26/2014 PCP: No PCP Per Patient  Assessment/Plan: 53 year old, African American male, who presents with nausea vomiting, diarrhea, diaphoresis, weakness, near syncope  1.  Near syncope with diaphoresis: Most likely due to GI illness/dehydration. Likely due to working outside in warm weather  -neuro exam no focal; no new symptoms; pend echo  -? Related to drug use, urine tox +opioids, cannabis, barbiturate; recommended to stop using drugs    2. Acute gastroenteritis: resolved on IVF, supportive care  3. Mildly elevated lactic acid: Probably due to hypovolemia. Cont IV fluids. He's afebrile. He has a normal WBC.  4. ttransient Wenckebach phenomenon: Most likely due to acute GI illness. Vs drug use;  -no arrythmia; TSH -0.24.; recheck tsh, free t4 as outpatient;  Follow up on results of echocardiogram.   Code Status: full Family Communication:  D/w patient, his mother  (indicate person spoken with, relationship, and if by phone, the number) Disposition Plan: home 24-48 hrs    Consultants:  none  Procedures:  echo  Antibiotics:  none (indicate start date, and stop date if known)  HPI/Subjective: alert  Objective: Filed Vitals:   07/27/14 0608  BP: 117/66  Pulse: 55  Temp: 98.7 F (37.1 C)  Resp: 18   No intake or output data in the 24 hours ending 07/27/14 1049 Filed Weights   07/26/14 1606  Weight: 68.04 kg (150 lb)    Exam:   General:  alert  Cardiovascular: s1,s2 rrr  Respiratory: CTA BL  Abdomen: soft, nt,nd   Musculoskeletal: no LE edema   Data Reviewed: Basic Metabolic Panel:  Recent Labs Lab 07/26/14 1113 07/27/14 0345  NA 142 144  K 3.3* 3.7  CL 104 109  CO2 21 23  GLUCOSE 134* 86  BUN 17 10  CREATININE 0.99 1.01  CALCIUM 9.4 8.3*   Liver Function Tests:  Recent Labs Lab 07/26/14 1113 07/27/14 0345  AST 28 29  ALT 22 18  ALKPHOS 62 49   BILITOT 0.4 0.4  PROT 7.6 5.9*  ALBUMIN 4.1 3.0*    Recent Labs Lab 07/26/14 1113  LIPASE 33   No results found for this basename: AMMONIA,  in the last 168 hours CBC:  Recent Labs Lab 07/26/14 1113 07/27/14 0345  WBC 9.2 9.6  NEUTROABS 5.3  --   HGB 14.7 12.4*  HCT 43.8 37.6*  MCV 93.4 92.2  PLT 206 184   Cardiac Enzymes:  Recent Labs Lab 07/26/14 1729 07/26/14 2131 07/27/14 0345  TROPONINI <0.30 <0.30 <0.30   BNP (last 3 results) No results found for this basename: PROBNP,  in the last 8760 hours CBG: No results found for this basename: GLUCAP,  in the last 168 hours  No results found for this or any previous visit (from the past 240 hour(s)).   Studies: Ct Abdomen Pelvis W Contrast  07/26/2014   CLINICAL DATA:  Mid to low abdominal pain.  EXAM: CT ABDOMEN AND PELVIS WITH CONTRAST  TECHNIQUE: Multidetector CT imaging of the abdomen and pelvis was performed using the standard protocol following bolus administration of intravenous contrast.  CONTRAST:  80mL OMNIPAQUE IOHEXOL 300 MG/ML  SOLN  COMPARISON:  CT abdomen pelvis 04/08/2012.  FINDINGS: A 9 mm hyperdense lesion in the right lobe of the liver is stable. The spleen is within normal limits. Today's study is performed during more arterial phase imaging. The  Stomach, duodenum, and pancreas are within normal limits. Common bile duct and  gallbladder are normal. The adrenal glands are normal bilaterally. A 6 mm posterior right renal cyst is again seen. The kidneys and ureters are otherwise unremarkable.  The rectosigmoid colon is within normal limits. The transverse and descending colon are mostly collapsed. The appendix is visualized and normal. Small bowel is within normal limits. Atherosclerotic calcifications are noted in the aorta and branch vessels.  Moderate prominence of the prostate gland is again noted, somewhat advanced for age. The gland measures 4.5 cm in transverse diameter the does grade some impression on  the base of the bladder. The right testicle the somewhat high-riding within the right angle canal compared to prior studies.  The bone windows are unremarkable. Chronic degenerative changes are noted at the L5-S1 disc.  IMPRESSION: 1. 9 mm hyperdense lesion in the right lobe of the liver is stable with the prior exam 2. Stable right renal cyst. 3. No acute or focal lesion to explain the patient's symptoms. 4. Moderate prominence of the prostate gland. 5. High-riding right testicle may be positional. Please correlate with physical exam.   Electronically Signed   By: Gennette Pachris  Mattern M.D.   On: 07/26/2014 13:52   Dg Chest Portable 1 View  07/26/2014   CLINICAL DATA:  Chest and abdominal pain.  EXAM: PORTABLE CHEST - 1 VIEW  COMPARISON:  None.  FINDINGS: The heart size is normal. The lungs are clear. Defibrillator pads are in place. The visualized soft tissues and bony thorax are unremarkable.  IMPRESSION: No active disease.   Electronically Signed   By: Gennette Pachris  Mattern M.D.   On: 07/26/2014 12:07    Scheduled Meds: . aspirin EC  81 mg Oral Daily  . enoxaparin (LOVENOX) injection  40 mg Subcutaneous Q24H  . pantoprazole  40 mg Oral Q1200  . sodium chloride  3 mL Intravenous Q12H   Continuous Infusions:   Principal Problem:   Near syncope Active Problems:   Acute gastroenteritis   Rigors   Wenckebach phenomenon    Time spent: >35 minutes     Noah Avila  [facility] Pager [phone]. If 7PM-7AM, please contact night-coverage at [email or web address], password [email or web address] 07/27/2014, 10:49 AM  LOS: 1 day

## 2014-07-27 NOTE — Progress Notes (Signed)
Nutrition Brief Note  Patient identified on the Malnutrition Screening Tool (MST) Report for unintentional weight loss. Recent weight loss of unknown amount is related to dehydration with recent GI illness with nausea, vomiting, and diarrhea.   Wt Readings from Last 15 Encounters:  07/26/14 150 lb (68.04 kg)    Body mass index is 22.14 kg/(m^2). Patient meets criteria for normal weight based on current BMI.   Current diet order is Regular, patient is consuming approximately 100% of meals at this time. Labs and medications reviewed.   No nutrition interventions warranted at this time. If nutrition issues arise, please consult RD.   Noah Avila CourtsKimberly Camera Krienke, RD, LDN, CNSC Pager [phone] After Hours Pager [phone]

## 2014-07-27 NOTE — Progress Notes (Signed)
Patient discharged to home.  Patient alert, oriented, verbally responsive, breathing regular and non-labored throughout, no s/s of distress noted throughout, no c/o pain throughout.  Discharge instructions thoroughly verbalized to patient and family at bedside.  All verbalized understanding throughout.  Patient left unit per wheelchair accompanied by Burt Ekequia RN.  VS WNL.  Noah Avila,Noah Avila 07/27/2014

## 2014-07-27 NOTE — Progress Notes (Signed)
UR completed 

## 2014-07-27 NOTE — Discharge Instructions (Signed)
Please follow up with primary care doctor in 1-2 week s

## 2017-01-09 ENCOUNTER — Encounter (HOSPITAL_COMMUNITY): Payer: Self-pay | Admitting: Emergency Medicine

## 2017-01-09 ENCOUNTER — Emergency Department (HOSPITAL_COMMUNITY): Payer: No Typology Code available for payment source

## 2017-01-09 ENCOUNTER — Emergency Department (HOSPITAL_COMMUNITY)
Admission: EM | Admit: 2017-01-09 | Discharge: 2017-01-09 | Disposition: A | Discharge status: Home / Self Care | Payer: No Typology Code available for payment source | Attending: Emergency Medicine | Admitting: Emergency Medicine

## 2017-01-09 DIAGNOSIS — R1031 Right lower quadrant pain: Secondary | ICD-10-CM | POA: Insufficient documentation

## 2017-01-09 DIAGNOSIS — R109 Unspecified abdominal pain: Secondary | ICD-10-CM

## 2017-01-09 HISTORY — DX: Gastro-esophageal reflux disease without esophagitis: K21.9

## 2017-01-09 LAB — URINALYSIS, ROUTINE W REFLEX MICROSCOPIC
BACTERIA UA: NONE SEEN
Bilirubin Urine: NEGATIVE
Glucose, UA: NEGATIVE mg/dL
KETONES UR: 80 mg/dL — AB
Nitrite: NEGATIVE
PROTEIN: 30 mg/dL — AB
Specific Gravity, Urine: 1.028 (ref 1.005–1.030)
Squamous Epithelial / LPF: NONE SEEN
pH: 6 (ref 5.0–8.0)

## 2017-01-09 LAB — COMPREHENSIVE METABOLIC PANEL
ALBUMIN: 4.7 g/dL (ref 3.5–5.0)
ALK PHOS: 58 U/L (ref 38–126)
ALT: 29 U/L (ref 17–63)
ANION GAP: 12 (ref 5–15)
AST: 35 U/L (ref 15–41)
BUN: 14 mg/dL (ref 6–20)
CHLORIDE: 108 mmol/L (ref 101–111)
CO2: 23 mmol/L (ref 22–32)
Calcium: 9.7 mg/dL (ref 8.9–10.3)
Creatinine, Ser: 1.18 mg/dL (ref 0.61–1.24)
GFR calc Af Amer: >60 mL/min (ref >60)
GFR calc non Af Amer: >60 mL/min (ref >60)
GLUCOSE: 150 mg/dL — AB (ref 65–99)
POTASSIUM: 3.5 mmol/L (ref 3.5–5.1)
SODIUM: 143 mmol/L (ref 135–145)
Total Bilirubin: 0.9 mg/dL (ref 0.3–1.2)
Total Protein: 8.1 g/dL (ref 6.5–8.1)

## 2017-01-09 LAB — CBC
HEMATOCRIT: 43.2 % (ref 39.0–52.0)
HEMOGLOBIN: 15.1 g/dL (ref 13.0–17.0)
MCH: 31.3 pg (ref 26.0–34.0)
MCHC: 35 g/dL (ref 30.0–36.0)
MCV: 89.4 fL (ref 78.0–100.0)
Platelets: 255 10*3/uL (ref 150–400)
RBC: 4.83 MIL/uL (ref 4.22–5.81)
RDW: 12.9 % (ref 11.5–15.5)
WBC: 12.5 10*3/uL — ABNORMAL HIGH (ref 4.0–10.5)

## 2017-01-09 LAB — LIPASE, BLOOD: Lipase: 22 U/L (ref 11–51)

## 2017-01-09 MED ORDER — ONDANSETRON HCL 4 MG/2ML IJ SOLN
4.0000 mg | Freq: Once | INTRAMUSCULAR | Status: AC
  Administered 2017-01-09: 4 mg via INTRAVENOUS
  Filled 2017-01-09: qty 2

## 2017-01-09 MED ORDER — MORPHINE SULFATE (PF) 4 MG/ML IV SOLN
8.0000 mg | Freq: Once | INTRAVENOUS | Status: AC
  Administered 2017-01-09: 8 mg via INTRAVENOUS
  Filled 2017-01-09: qty 2

## 2017-01-09 MED ORDER — MORPHINE SULFATE 15 MG PO TABS: 15.0000 mg | ORAL_TABLET | ORAL | 0 refills | Status: DC | PRN

## 2017-01-09 MED ORDER — MORPHINE SULFATE (PF) 4 MG/ML IV SOLN: 4.0000 mg | Freq: Once | INTRAVENOUS | Status: DC

## 2017-01-09 MED ORDER — ONDANSETRON 4 MG PO TBDP: ORAL_TABLET | ORAL | 0 refills | Status: DC

## 2017-01-09 MED ORDER — KETOROLAC TROMETHAMINE 30 MG/ML IJ SOLN
30.0000 mg | Freq: Once | INTRAMUSCULAR | Status: AC
  Administered 2017-01-09: 30 mg via INTRAVENOUS
  Filled 2017-01-09: qty 1

## 2017-01-09 MED ORDER — SODIUM CHLORIDE 0.9 % IV BOLUS (SEPSIS)
1000.0000 mL | Freq: Once | INTRAVENOUS | Status: AC
  Administered 2017-01-09: 1000 mL via INTRAVENOUS

## 2017-01-09 MED ORDER — ONDANSETRON 4 MG PO TBDP
4.0000 mg | ORAL_TABLET | Freq: Once | ORAL | Status: AC | PRN
Start: 2017-01-09 — End: 2017-01-09
  Administered 2017-01-09: 4 mg via ORAL
  Filled 2017-01-09: qty 1

## 2017-01-09 NOTE — ED Notes (Signed)
Patient d/c'd self care.  F/U and medications reviewed.  Patient verbalized understanding. 

## 2017-01-09 NOTE — ED Triage Notes (Signed)
Pt twisted a few days ago and began to have lower back pain. Since then pain has moved around side to abd. Has had emesis and diarrhea.

## 2017-01-09 NOTE — ED Notes (Signed)
Patient transported to CT 

## 2017-01-09 NOTE — Discharge Instructions (Signed)

## 2017-01-09 NOTE — ED Provider Notes (Signed)
[facility]-EMERGENCY DEPT Provider Note   CSN: [id number] Arrival date & time: 01/09/17  1203     History   Chief Complaint Chief Complaint  Patient presents with  . Abdominal Pain  . Emesis    HPI Noah Avila is a 56 y.o. male.  56 yo M with a cc of right flank pain. This started a couple days ago after he had moved the wrong way. Patient has pain worse with movement or palpation. The pain then migrated to his right lower quadrant to say with vomiting and nausea. Feels that it's constant. Worse with moving around bumps car. Having some subjective fevers and chills.   The history is provided by the patient.  Abdominal Pain   This is a new problem. The current episode started more than 2 days ago. The problem occurs constantly. The problem has been rapidly worsening. The pain is located in the RLQ. The pain is at a severity of 10/10. The pain is severe. Associated symptoms include nausea and vomiting. Pertinent negatives include fever, diarrhea, headaches, arthralgias and myalgias. Nothing aggravates the symptoms. Nothing relieves the symptoms.  Emesis   Associated symptoms include abdominal pain. Pertinent negatives include no arthralgias, no chills, no diarrhea, no fever, no headaches and no myalgias.    Past Medical History:  Diagnosis Date  . Family history of anesthesia complication    "mom has PONV"  . GERD (gastroesophageal reflux disease)   . Headache(784.0)    "bad ones; not regular" (07/26/2014)  . Nephrolithiasis     Patient Active Problem List   Diagnosis Date Noted  . Near syncope 07/26/2014  . Acute gastroenteritis 07/26/2014  . Rigors 07/26/2014  . 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 NO PAST SURGERIES         Home Medications    Prior to Admission medications   Medication Sig Start Date End Date Taking? Authorizing Provider  famotidine (PEPCID) 40 MG tablet Take 40 mg by mouth daily.     Historical Provider, MD  morphine (MSIR) 15 MG tablet Take 1 tablet (15 mg total) by mouth every 4 (four) hours as needed for severe pain. 01/09/17   Melene Planan Dyamond Tolosa, DO  ondansetron (ZOFRAN ODT) 4 MG disintegrating tablet 4mg  ODT q4 hours prn nausea/vomit 01/09/17   Melene Planan Xander Jutras, DO  pantoprazole (PROTONIX) 40 MG tablet Take 1 tablet (40 mg total) by mouth daily. 07/27/14   Esperanza SheetsUlugbek N Buriev, MD    Family History Family History  Problem Relation Age of Onset  . Coronary artery disease Mother   . Lung cancer Mother   . Renal Disease Father   . Diabetes Father   . Heart disease Father   . Heart disease Brother     Social History Social History  Substance Use Topics  . Smoking status: Never Smoker  . Smokeless tobacco: Never Used  . Alcohol use No     Allergies   Patient has no known allergies.   Review of Systems Review of Systems  Constitutional: Negative for chills and fever.  HENT: Negative for congestion and facial swelling.   Eyes: Negative for discharge and visual disturbance.  Respiratory: Negative for shortness of breath.   Cardiovascular: Negative for chest pain and palpitations.  Gastrointestinal: Positive for abdominal pain, nausea and vomiting. Negative for diarrhea.  Musculoskeletal: Negative for arthralgias and myalgias.  Skin: Negative for color change and rash.  Neurological: Negative for tremors, syncope and headaches.  Psychiatric/Behavioral: Negative  for confusion and dysphoric mood.     Physical Exam Updated Vital Signs BP 146/85 (BP Location: Left Arm)   Pulse 73   Temp 97.6 F (36.4 C) (Oral)   Resp 16   SpO2 100%   Physical Exam  Constitutional: He is oriented to person, place, and time. He appears well-developed and well-nourished.  HENT:  Head: Normocephalic and atraumatic.  Eyes: EOM are normal. Pupils are equal, round, and reactive to light.  Neck: Normal range of motion. Neck supple. No JVD present.  Cardiovascular: Normal rate and regular  rhythm.  Exam reveals no gallop and no friction rub.   No murmur heard. Pulmonary/Chest: No respiratory distress. He has no wheezes.  Abdominal: He exhibits no distension and no mass. There is tenderness (worst to the RLQ, mild R flank pain). There is no rebound and no guarding.  Musculoskeletal: Normal range of motion.  Neurological: He is alert and oriented to person, place, and time.  Skin: No rash noted. No pallor.  Psychiatric: He has a normal mood and affect. His behavior is normal.  Nursing note and vitals reviewed.    ED Treatments / Results  Labs (all labs ordered are listed, but only abnormal results are displayed) Labs Reviewed  COMPREHENSIVE METABOLIC PANEL - Abnormal; Notable for the following:       Result Value   Glucose, Bld 150 (*)    All other components within normal limits  CBC - Abnormal; Notable for the following:    WBC 12.5 (*)    All other components within normal limits  URINALYSIS, ROUTINE W REFLEX MICROSCOPIC - Abnormal; Notable for the following:    Hgb urine dipstick MODERATE (*)    Ketones, ur 80 (*)    Protein, ur 30 (*)    Leukocytes, UA TRACE (*)    All other components within normal limits  LIPASE, BLOOD    EKG  EKG Interpretation None       Radiology Ct Renal Stone Study  Result Date: 01/09/2017 CLINICAL DATA:  Twisted injury a few days ago, began and lower back pain, pain has moved to the side of abdomen, vomiting, diarrhea, RIGHT-side abdominal and flank pain, history of kidney stones EXAM: CT ABDOMEN AND PELVIS WITHOUT CONTRAST TECHNIQUE: Multidetector CT imaging of the abdomen and pelvis was performed following the standard protocol without IV contrast. Sagittal and coronal MPR images reconstructed from axial data set. No oral contrast was administered for this indication. COMPARISON:  07/26/2014 FINDINGS: Lower chest: Lung bases clear Hepatobiliary: Liver and gallbladder normal appearance Pancreas: Normal appearance Spleen: Normal  appearance Adrenals/Urinary Tract: Adrenal glands normal appearance. Small RIGHT renal cyst unchanged. Kidneys otherwise normal appearance without additional mass or hydronephrosis. No urinary tract calcification, ureteral dilatation or ureteral calcification. Bladder unremarkable. Mild prostatic enlargement gland 4.7 x 4.0 cm image 74. Stomach/Bowel: Normal appendix. Stomach and bowel loops normal appearance. Vascular/Lymphatic: Atherosclerotic calcification aorta without aneurysm. No adenopathy. Reproductive: N/A Other: Umbilical hernia containing fat.  No free air free fluid. Musculoskeletal: Unremarkable IMPRESSION: No acute intra-abdominal or intrapelvic abnormalities. Mild prostatic enlargement. Umbilical hernia containing fat. Aortic atherosclerosis. Electronically Signed   By: Ulyses Southward M.D.   On: 01/09/2017 15:21    Procedures Procedures (including critical care time)  Medications Ordered in ED Medications  morphine 4 MG/ML injection 8 mg (not administered)  ondansetron (ZOFRAN) injection 4 mg (not administered)  ketorolac (TORADOL) 30 MG/ML injection 30 mg (not administered)  ondansetron (ZOFRAN-ODT) disintegrating tablet 4 mg (4 mg Oral Given 01/09/17  1230)  ondansetron (ZOFRAN) injection 4 mg (4 mg Intravenous Given 01/09/17 1447)  sodium chloride 0.9 % bolus 1,000 mL (1,000 mLs Intravenous New Bag/Given 01/09/17 1446)  morphine 4 MG/ML injection 8 mg (8 mg Intravenous Given 01/09/17 1448)     Initial Impression / Assessment and Plan / ED Course  I have reviewed the triage vital signs and the nursing notes.  Pertinent labs & imaging results that were available during my care of the patient were reviewed by me and considered in my medical decision making (see chart for details).     56 yo M With a chief complaint of right flank pain radiating into his abdomen. Suspect this is likely kidney stone though patient has pain worst in the right lower quadrant leukocytosis and vomiting.  Will obtain a CT stone study to evaluate for kidney stone versus appendicitis.  Imaging is negative. I discussed the images with the radiologist. Patient reassessed and feeling better. We'll send the patient home with some pain and nausea medicine. Strict return precautions.  3:35 PM:  I have discussed the diagnosis/risks/treatment options with the patient and family and believe the pt to be eligible for discharge home to follow-up with PCP. We also discussed returning to the ED immediately if new or worsening sx occur. We discussed the sx which are most concerning (e.g., sudden worsening pain, fever, inability to tolerate by mouth) that necessitate immediate return. Medications administered to the patient during their visit and any new prescriptions provided to the patient are listed below.  Medications given during this visit Medications  morphine 4 MG/ML injection 8 mg (not administered)  ondansetron (ZOFRAN) injection 4 mg (not administered)  ketorolac (TORADOL) 30 MG/ML injection 30 mg (not administered)  ondansetron (ZOFRAN-ODT) disintegrating tablet 4 mg (4 mg Oral Given 01/09/17 1230)  ondansetron (ZOFRAN) injection 4 mg (4 mg Intravenous Given 01/09/17 1447)  sodium chloride 0.9 % bolus 1,000 mL (1,000 mLs Intravenous New Bag/Given 01/09/17 1446)  morphine 4 MG/ML injection 8 mg (8 mg Intravenous Given 01/09/17 1448)     The patient appears reasonably screen and/or stabilized for discharge and I doubt any other medical condition or other [facility] requiring further screening, evaluation, or treatment in the ED at this time prior to discharge.    Final Clinical Impressions(s) / ED Diagnoses   Final diagnoses:  Right flank pain    New Prescriptions New Prescriptions   MORPHINE (MSIR) 15 MG TABLET    Take 1 tablet (15 mg total) by mouth every 4 (four) hours as needed for severe pain.   ONDANSETRON (ZOFRAN ODT) 4 MG DISINTEGRATING TABLET    4mg  ODT q4 hours prn nausea/vomit     Melene Plan,  DO 01/09/17 1535

## 2017-05-21 ENCOUNTER — Emergency Department (HOSPITAL_COMMUNITY): Payer: No Typology Code available for payment source

## 2017-05-21 ENCOUNTER — Encounter (HOSPITAL_COMMUNITY): Payer: Self-pay | Admitting: Emergency Medicine

## 2017-05-21 ENCOUNTER — Emergency Department (HOSPITAL_COMMUNITY)
Admission: EM | Admit: 2017-05-21 | Discharge: 2017-05-21 | Disposition: A | Discharge status: Home / Self Care | Payer: No Typology Code available for payment source | Attending: Emergency Medicine | Admitting: Emergency Medicine

## 2017-05-21 DIAGNOSIS — R7989 Other specified abnormal findings of blood chemistry: Secondary | ICD-10-CM

## 2017-05-21 DIAGNOSIS — R1013 Epigastric pain: Secondary | ICD-10-CM | POA: Insufficient documentation

## 2017-05-21 DIAGNOSIS — R1010 Upper abdominal pain, unspecified: Secondary | ICD-10-CM

## 2017-05-21 DIAGNOSIS — R740 Nonspecific elevation of levels of transaminase and lactic acid dehydrogenase [LDH]: Secondary | ICD-10-CM | POA: Insufficient documentation

## 2017-05-21 DIAGNOSIS — Z79899 Other long term (current) drug therapy: Secondary | ICD-10-CM | POA: Insufficient documentation

## 2017-05-21 DIAGNOSIS — R112 Nausea with vomiting, unspecified: Secondary | ICD-10-CM | POA: Insufficient documentation

## 2017-05-21 LAB — CBG MONITORING, ED: GLUCOSE-CAPILLARY: 147 mg/dL — AB (ref 65–99)

## 2017-05-21 LAB — TROPONIN I
Troponin I: <0.03 ng/mL (ref <0.03)
Troponin I: <0.03 ng/mL (ref <0.03)

## 2017-05-21 LAB — URINALYSIS, ROUTINE W REFLEX MICROSCOPIC
Bacteria, UA: NONE SEEN
Bilirubin Urine: NEGATIVE
Glucose, UA: NEGATIVE mg/dL
Ketones, ur: 5 mg/dL — AB
Leukocytes, UA: NEGATIVE
Nitrite: NEGATIVE
Protein, ur: NEGATIVE mg/dL
Specific Gravity, Urine: 1.042 — ABNORMAL HIGH (ref 1.005–1.030)
Squamous Epithelial / HPF: NONE SEEN
pH: 8 (ref 5.0–8.0)

## 2017-05-21 LAB — CBC WITH DIFFERENTIAL/PLATELET
Basophils Absolute: 0 10*3/uL (ref 0.0–0.1)
Basophils Relative: 0 %
Eosinophils Absolute: 0.3 10*3/uL (ref 0.0–0.7)
Eosinophils Relative: 2 %
HCT: 43.6 % (ref 39.0–52.0)
Hemoglobin: 14.6 g/dL (ref 13.0–17.0)
Lymphocytes Relative: 40 %
Lymphs Abs: 4.5 10*3/uL — ABNORMAL HIGH (ref 0.7–4.0)
MCH: 30.9 pg (ref 26.0–34.0)
MCHC: 33.5 g/dL (ref 30.0–36.0)
MCV: 92.4 fL (ref 78.0–100.0)
Monocytes Absolute: 0.5 10*3/uL (ref 0.1–1.0)
Monocytes Relative: 5 %
Neutro Abs: 5.9 10*3/uL (ref 1.7–7.7)
Neutrophils Relative %: 53 %
Platelets: 220 10*3/uL (ref 150–400)
RBC: 4.72 MIL/uL (ref 4.22–5.81)
RDW: 13 % (ref 11.5–15.5)
WBC: 11.2 10*3/uL — ABNORMAL HIGH (ref 4.0–10.5)

## 2017-05-21 LAB — I-STAT CG4 LACTIC ACID, ED
Lactic Acid, Venous: 3.9 mmol/L (ref 0.5–1.9)
Lactic Acid, Venous: 2.36 mmol/L (ref 0.5–1.9)
Lactic Acid, Venous: 2.55 mmol/L (ref 0.5–1.9)

## 2017-05-21 LAB — I-STAT CHEM 8, ED
BUN: 19 mg/dL (ref 6–20)
Calcium, Ion: 1.14 mmol/L — ABNORMAL LOW (ref 1.15–1.40)
Chloride: 107 mmol/L (ref 101–111)
Creatinine, Ser: 1.2 mg/dL (ref 0.61–1.24)
Glucose, Bld: 127 mg/dL — ABNORMAL HIGH (ref 65–99)
HEMATOCRIT: 45 % (ref 39.0–52.0)
HEMOGLOBIN: 15.3 g/dL (ref 13.0–17.0)
Potassium: 3.7 mmol/L (ref 3.5–5.1)
Sodium: 143 mmol/L (ref 135–145)
TCO2: 22 mmol/L (ref 0–100)

## 2017-05-21 LAB — COMPREHENSIVE METABOLIC PANEL
ALT: 17 U/L (ref 17–63)
AST: 26 U/L (ref 15–41)
Albumin: 4.2 g/dL (ref 3.5–5.0)
Alkaline Phosphatase: 60 U/L (ref 38–126)
Anion gap: 13 (ref 5–15)
BUN: 15 mg/dL (ref 6–20)
CO2: 20 mmol/L — ABNORMAL LOW (ref 22–32)
Calcium: 9.8 mg/dL (ref 8.9–10.3)
Chloride: 107 mmol/L (ref 101–111)
Creatinine, Ser: 1.29 mg/dL — ABNORMAL HIGH (ref 0.61–1.24)
GFR calc Af Amer: >60 mL/min (ref >60)
GFR calc non Af Amer: >60 mL/min (ref >60)
Glucose, Bld: 135 mg/dL — ABNORMAL HIGH (ref 65–99)
Potassium: 3.5 mmol/L (ref 3.5–5.1)
Sodium: 140 mmol/L (ref 135–145)
Total Bilirubin: 0.6 mg/dL (ref 0.3–1.2)
Total Protein: 7.7 g/dL (ref 6.5–8.1)

## 2017-05-21 LAB — LIPASE, BLOOD: Lipase: 31 U/L (ref 11–51)

## 2017-05-21 MED ORDER — MORPHINE SULFATE (PF) 4 MG/ML IV SOLN
4.0000 mg | Freq: Once | INTRAVENOUS | Status: AC
  Administered 2017-05-21: 4 mg via INTRAVENOUS
  Filled 2017-05-21: qty 1

## 2017-05-21 MED ORDER — PANTOPRAZOLE SODIUM 40 MG IV SOLR
40.0000 mg | Freq: Once | INTRAVENOUS | Status: AC
  Administered 2017-05-21: 40 mg via INTRAVENOUS
  Filled 2017-05-21: qty 40

## 2017-05-21 MED ORDER — HYDROMORPHONE HCL 1 MG/ML IJ SOLN
1.0000 mg | Freq: Once | INTRAMUSCULAR | Status: AC
  Administered 2017-05-21: 1 mg via INTRAVENOUS
  Filled 2017-05-21: qty 1

## 2017-05-21 MED ORDER — IOPAMIDOL (ISOVUE-370) INJECTION 76%
INTRAVENOUS | Status: AC
  Administered 2017-05-21: 100 mL
  Filled 2017-05-21: qty 100

## 2017-05-21 MED ORDER — DIPHENHYDRAMINE HCL 50 MG/ML IJ SOLN
12.5000 mg | Freq: Once | INTRAMUSCULAR | Status: AC
  Administered 2017-05-21: 12.5 mg via INTRAVENOUS
  Filled 2017-05-21: qty 1

## 2017-05-21 MED ORDER — PROMETHAZINE HCL 12.5 MG PO TABS: 12.5000 mg | ORAL_TABLET | Freq: Four times a day (QID) | ORAL | 0 refills | Status: DC | PRN

## 2017-05-21 MED ORDER — SODIUM CHLORIDE 0.9 % IV BOLUS (SEPSIS)
1000.0000 mL | Freq: Once | INTRAVENOUS | Status: AC
  Administered 2017-05-21: 1000 mL via INTRAVENOUS

## 2017-05-21 MED ORDER — METOCLOPRAMIDE HCL 5 MG/ML IJ SOLN
10.0000 mg | Freq: Once | INTRAMUSCULAR | Status: AC
  Administered 2017-05-21: 10 mg via INTRAVENOUS
  Filled 2017-05-21: qty 2

## 2017-05-21 MED ORDER — SUCRALFATE 1 G PO TABS: 1.0000 g | ORAL_TABLET | Freq: Three times a day (TID) | ORAL | 0 refills | Status: DC

## 2017-05-21 MED ORDER — ONDANSETRON HCL 4 MG/2ML IJ SOLN
4.0000 mg | Freq: Once | INTRAMUSCULAR | Status: AC
  Administered 2017-05-21: 4 mg via INTRAVENOUS
  Filled 2017-05-21: qty 2

## 2017-05-21 MED ORDER — PANTOPRAZOLE SODIUM 40 MG PO TBEC: 40.0000 mg | DELAYED_RELEASE_TABLET | Freq: Every day | ORAL | 0 refills | Status: DC

## 2017-05-21 MED ORDER — GI COCKTAIL ~~LOC~~
30.0000 mL | Freq: Once | ORAL | Status: AC
  Administered 2017-05-21: 30 mL via ORAL
  Filled 2017-05-21: qty 30

## 2017-05-21 MED ORDER — SODIUM CHLORIDE 0.9 % IV SOLN
Freq: Once | INTRAVENOUS | Status: AC
Start: 2017-05-21 — End: 2017-05-21
  Administered 2017-05-21: 21:00:00 via INTRAVENOUS

## 2017-05-21 MED ORDER — FAMOTIDINE 20 MG PO TABS: 20.0000 mg | ORAL_TABLET | Freq: Two times a day (BID) | ORAL | 0 refills | Status: DC

## 2017-05-21 NOTE — ED Triage Notes (Signed)
Pt taken to A3, was unable to obtain 12 lead in triage due to amount of sweating. Being obtained at this time

## 2017-05-21 NOTE — ED Notes (Signed)
Pt departed in NAD.  

## 2017-05-21 NOTE — ED Provider Notes (Signed)
[facility]-EMERGENCY DEPT Provider Note   CSN: [id number] Arrival date & time: 05/21/17  1320     History   [id number] Complaint Chief Complaint  Patient presents with  . Abdominal Pain    HPI Noah Avila is a 56 y.o. male.  HPI Patient presents to the emergency department with mid upper abdominal pain that started this morning.  The patient is complaining of severe pain is also complaining of chills and diaphoresis.  Patient is also complaining of nausea and vomiting 1 Patient states that he has stomach discomfort on a regular basis, but this is more severe, that he has ever experienced in the past.  Patient denies any abdominal surgeries.  Patient does take medications for GERD.  Patient states nothing seems make the condition better.  Palpation makes the pain worse. The patient denies chest pain, shortness of breath, headache,blurred vision, neck pain, fever, cough, weakness, numbness, dizziness, anorexia, edema,  diarrhea, rash, back pain, dysuria, hematemesis, bloody stool, near syncope, or syncope. Past Medical History:  Diagnosis Date  . Family history of anesthesia complication    "mom has PONV"  . GERD (gastroesophageal reflux disease)   . Headache(784.0)    "bad ones; not regular" (07/26/2014)  . Nephrolithiasis     Patient Active Problem List   Diagnosis Date Noted  . Near syncope 07/26/2014  . Acute gastroenteritis 07/26/2014  . Rigors 07/26/2014  . 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 NO PAST SURGERIES         Home Medications    Prior to Admission medications   Medication Sig Start Date End Date Taking? Authorizing Provider  famotidine (PEPCID) 40 MG tablet Take 40 mg by mouth daily.    [provider]  morphine (MSIR) 15 MG tablet Take 1 tablet (15 mg total) by mouth every 4 (four) hours as needed for severe pain. 01/09/17   Melene PlanFloyd, Dan, DO  ondansetron (ZOFRAN ODT) 4 MG disintegrating tablet 4mg  ODT  q4 hours prn nausea/vomit 01/09/17   Melene PlanFloyd, Dan, DO  pantoprazole (PROTONIX) 40 MG tablet Take 1 tablet (40 mg total) by mouth daily. 07/27/14   [name], Ulugbek N, MD    Family History Family History  Problem Relation Age of Onset  . Coronary artery disease Mother   . Lung cancer Mother   . Renal Disease Father   . Diabetes Father   . Heart disease Father   . Heart disease Brother     Social History Social History  Substance Use Topics  . Smoking status: Never Smoker  . Smokeless tobacco: Never Used  . Alcohol use No     Allergies   Patient has no known allergies.   Review of Systems Review of Systems All other systems negative except as documented in the HPI. All pertinent positives and negatives as reviewed in the HPI.  Physical Exam Updated Vital Signs BP (!) 148/95   Pulse 66   Temp (S) 98.2 F (36.8 C) (Rectal)   Resp (!) 32   SpO2 100%   Physical Exam  Constitutional: He is oriented to person, place, and time. He appears well-developed and well-nourished. No distress.  HENT:  Head: Normocephalic and atraumatic.  Mouth/Throat: Oropharynx is clear and moist.  Eyes: Pupils are equal, round, and reactive to light.  Neck: Normal range of motion. Neck supple.  Cardiovascular: Normal rate, regular rhythm and normal heart sounds.  Exam reveals no gallop and no friction rub.   No  murmur heard. Pulmonary/Chest: Effort normal and breath sounds normal. No respiratory distress. He has no wheezes.  Abdominal: Soft. Bowel sounds are normal. He exhibits no distension and no mass. There is tenderness. There is guarding. There is no rebound.    Neurological: He is alert and oriented to person, place, and time. He exhibits normal muscle tone. Coordination normal.  Skin: Skin is warm and dry. Capillary refill takes less than 2 seconds. No rash noted. No erythema.  Psychiatric: He has a normal mood and affect. His behavior is normal.  Nursing note and vitals reviewed.    ED  Treatments / Results  Labs (all labs ordered are listed, but only abnormal results are displayed) Labs Reviewed  CBG MONITORING, ED - Abnormal; Notable for the following:       Result Value   Glucose-Capillary 147 (*)    All other components within normal limits  COMPREHENSIVE METABOLIC PANEL  ETHANOL  LIPASE, BLOOD  TROPONIN I  CBC WITH DIFFERENTIAL/PLATELET  URINALYSIS, ROUTINE W REFLEX MICROSCOPIC  I-STAT CG4 LACTIC ACID, ED    EKG  EKG Interpretation  Date/Time:  Wednesday May 21 2017 13:53:02 EDT Ventricular Rate:  51 PR Interval:    QRS Duration: 81 QT Interval:  448 QTC Calculation: 413 R Axis:   77 Text Interpretation:  Sinus rhythm Prolonged PR interval Left ventricular hypertrophy ST elev, probable normal early repol pattern baseline wander limits interpretation no significant change since 2015 Confirmed by Pricilla Loveless [phone]) on 05/21/2017 1:57:59 PM       Radiology No results found.  Procedures Procedures (including critical care time)  Medications Ordered in ED Medications  ondansetron (ZOFRAN) injection 4 mg (4 mg Intravenous Given 05/21/17 1404)  morphine 4 MG/ML injection 4 mg (4 mg Intravenous Given 05/21/17 1404)     Initial Impression / Assessment and Plan / ED Course  I have reviewed the triage vital signs and the nursing notes.  Pertinent labs & imaging results that were available during my care of the patient were reviewed by me and considered in my medical decision making (see chart for details).     Will obtain laboratory testing, along with portable chest x-ray to look for free air under the diaphragm.  We will proceed with CT MG of the chest, abdomen pelvis for any further delineation of his symptoms if there is no free air.   Final Clinical Impressions(s) / ED Diagnoses   Final diagnoses:  None    New Prescriptions New Prescriptions   No medications on file     Charlestine Night, Cordelia Poche 05/22/17 1646    Pricilla Loveless,  MD 05/28/17 Dorna Mai    Pricilla Loveless, MD 05/29/17 0002

## 2017-05-21 NOTE — ED Provider Notes (Signed)
Patient signed out to me at shift change. Patient with acute onset of epigastric abdominal pain radiating to the chest onset this morning. Initial workup showed elevated lactic acid of 2.9, normal electrolytes other than glucose of 135, CO2 20, creatinine 1.29. White blood cell count is 11.2. Patient had CT chest and abdomen pelvis, negative for dissection or any other acute abnormalities. Plan to hydrate, repeat troponin and lactic acid after hydration and pain management.   8:30 PM Patient continues to have severe pain. I will add more pain medications and try GI cocktail. Patient is still nauseated however not vomiting. Lactic acid improved however still 2.3. I will call for admission for observation and abdominal re-examination.  Spoke with hospitalist, advised that patient is not vomiting and does not qualify for inpatient admission. I will perform an oral fluid challenge.  9:21 PM Patient is actually feeling better. He is drinking ginger ale without vomiting. I discussed with him that since he is improving we will try to send him home with some medications for his pain and vomiting. I will start him on Protonix, Pepcid, Carafate, Phenergan for nausea and vomiting. Advised him follow-up with gastroenterology. I suspect he may have some peptic ulcers versus gastritis. Advised him to return if worsening symptoms. Patient agreed and voiced understanding.   Vitals:   05/21/17 2030 05/21/17 2045 05/21/17 2100 05/21/17 2115  BP: 111/64 122/69 120/73 114/70  Pulse: 68 70 68 72  Resp: 15     Temp:      TempSrc:      SpO2: 99% 100% 100% 99%      Jaynie CrumbleKirichenko, Tarren Sabree, PA-C 05/21/17 2131    Tegeler, Canary Brimhristopher J, MD 05/22/17 [phone]

## 2017-05-21 NOTE — ED Notes (Signed)
Portable xray at bedside.

## 2017-05-21 NOTE — ED Notes (Signed)
Lactic acid result given to Dr. Tegeler 

## 2017-05-21 NOTE — Discharge Instructions (Signed)
Drink plenty of fluids. Take Phenergan as prescribed as needed for nausea and vomiting. Take Protonix, Pepcid, Carafate as prescribed. Avoid any spicy, fatty, tomato based foods. Avoid any ibuprofen or Aleve. Please follow-up with gastroenterologist if symptoms are not improving. Return to emergency department if worsening.

## 2017-05-21 NOTE — ED Notes (Signed)
Patient transported to CT 

## 2017-05-21 NOTE — ED Notes (Signed)
Pt's CBG 147.  Informed Chelsea, RN.

## 2017-05-21 NOTE — ED Triage Notes (Signed)
Pt to ER with complaint of epigastric abd pain onset this morning with nausea and vomiting. Pt is profusely sweating at present, BP readings WNL on bilateral arms. Pt is pale on arrival, denies any med hx.

## 2018-08-13 ENCOUNTER — Emergency Department (HOSPITAL_COMMUNITY): Payer: No Typology Code available for payment source

## 2018-08-13 ENCOUNTER — Emergency Department (HOSPITAL_COMMUNITY)
Admission: EM | Admit: 2018-08-13 | Discharge: 2018-08-13 | Disposition: A | Discharge status: Home / Self Care | Payer: No Typology Code available for payment source | Attending: Emergency Medicine | Admitting: Emergency Medicine

## 2018-08-13 ENCOUNTER — Encounter (HOSPITAL_COMMUNITY): Payer: Self-pay

## 2018-08-13 ENCOUNTER — Other Ambulatory Visit: Payer: Self-pay

## 2018-08-13 DIAGNOSIS — R112 Nausea with vomiting, unspecified: Secondary | ICD-10-CM | POA: Diagnosis not present

## 2018-08-13 DIAGNOSIS — R1013 Epigastric pain: Secondary | ICD-10-CM | POA: Insufficient documentation

## 2018-08-13 LAB — COMPREHENSIVE METABOLIC PANEL
ALBUMIN: 4.1 g/dL (ref 3.5–5.0)
ALK PHOS: 68 U/L (ref 38–126)
ALT: 23 U/L (ref 0–44)
AST: 28 U/L (ref 15–41)
Anion gap: 11 (ref 5–15)
BILIRUBIN TOTAL: 0.7 mg/dL (ref 0.3–1.2)
BUN: 8 mg/dL (ref 6–20)
CALCIUM: 9.7 mg/dL (ref 8.9–10.3)
CO2: 26 mmol/L (ref 22–32)
CREATININE: 1.22 mg/dL (ref 0.61–1.24)
Chloride: 108 mmol/L (ref 98–111)
GFR calc Af Amer: >60 mL/min (ref >60)
GFR calc non Af Amer: >60 mL/min (ref >60)
GLUCOSE: 171 mg/dL — AB (ref 70–99)
Potassium: 3.2 mmol/L — ABNORMAL LOW (ref 3.5–5.1)
SODIUM: 145 mmol/L (ref 135–145)
TOTAL PROTEIN: 7.6 g/dL (ref 6.5–8.1)

## 2018-08-13 LAB — CBC
HCT: 46.4 % (ref 39.0–52.0)
HEMOGLOBIN: 14.9 g/dL (ref 13.0–17.0)
MCH: 30.7 pg (ref 26.0–34.0)
MCHC: 32.1 g/dL (ref 30.0–36.0)
MCV: 95.5 fL (ref 78.0–100.0)
PLATELETS: 272 10*3/uL (ref 150–400)
RBC: 4.86 MIL/uL (ref 4.22–5.81)
RDW: 12.9 % (ref 11.5–15.5)
WBC: 10.3 10*3/uL (ref 4.0–10.5)

## 2018-08-13 LAB — I-STAT TROPONIN, ED: TROPONIN I, POC: 0 ng/mL (ref 0.00–0.08)

## 2018-08-13 LAB — LIPASE, BLOOD: Lipase: 38 U/L (ref 11–51)

## 2018-08-13 MED ORDER — FAMOTIDINE IN NACL 20-0.9 MG/50ML-% IV SOLN
20.0000 mg | Freq: Once | INTRAVENOUS | Status: AC
  Administered 2018-08-13: 20 mg via INTRAVENOUS
  Filled 2018-08-13: qty 50

## 2018-08-13 MED ORDER — SODIUM CHLORIDE 0.9 % IV BOLUS
1000.0000 mL | Freq: Once | INTRAVENOUS | Status: AC
  Administered 2018-08-13: 1000 mL via INTRAVENOUS

## 2018-08-13 MED ORDER — DICYCLOMINE HCL 20 MG PO TABS: 20.0000 mg | ORAL_TABLET | Freq: Three times a day (TID) | ORAL | 0 refills | Status: DC | PRN

## 2018-08-13 MED ORDER — SUCRALFATE 1 G PO TABS: 1.0000 g | ORAL_TABLET | Freq: Three times a day (TID) | ORAL | 0 refills | Status: DC

## 2018-08-13 MED ORDER — HALOPERIDOL LACTATE 5 MG/ML IJ SOLN
5.0000 mg | Freq: Once | INTRAMUSCULAR | Status: AC
  Administered 2018-08-13: 5 mg via INTRAVENOUS
  Filled 2018-08-13: qty 1

## 2018-08-13 MED ORDER — METOCLOPRAMIDE HCL 10 MG PO TABS: 10.0000 mg | ORAL_TABLET | Freq: Three times a day (TID) | ORAL | 0 refills | Status: DC | PRN

## 2018-08-13 NOTE — ED Provider Notes (Signed)
Emergency Department Provider Note   I have reviewed the triage vital signs and the nursing notes.   HISTORY  Chief Complaint Abdominal Pain   HPI Noah Avila is a 57 y.o. male with PMH of gastroparesis, kidney stones, and GERD resents to the emergency department for evaluation of acute onset epigastric pain with nausea and vomiting.  Patient has a family member with similar symptoms.  Patient states this feels similar to his prior gastroparesis episodes.  He denies any chest pain or dyspnea.  No fevers or chills.  No radiation of symptoms or other modifying factors.  The patient denies any drug or alcohol use.   Past Medical History:  Diagnosis Date  . Family history of anesthesia complication    "mom has PONV"  . GERD (gastroesophageal reflux disease)   . Headache(784.0)    "bad ones; not regular" (07/26/2014)  . Nephrolithiasis     Patient Active Problem List   Diagnosis Date Noted  . Near syncope 07/26/2014  . Acute gastroenteritis 07/26/2014  . Rigors 07/26/2014  . 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 NO PAST SURGERIES     Allergies Patient has no known allergies.  Family History  Problem Relation Age of Onset  . Coronary artery disease Mother   . Lung cancer Mother   . Renal Disease Father   . Diabetes Father   . Heart disease Father   . Heart disease Brother     Social History Social History   Tobacco Use  . Smoking status: Never Smoker  . Smokeless tobacco: Never Used  Substance Use Topics  . Alcohol use: No  . Drug use: No    Review of Systems  Constitutional: No fever/chills Eyes: No visual changes. ENT: No sore throat. Cardiovascular: Denies chest pain. Respiratory: Denies shortness of breath. Gastrointestinal: Positive severe epigastric abdominal pain. Positive nausea and  vomiting.  No diarrhea.  No constipation. Genitourinary: Negative for dysuria. Musculoskeletal: Negative  for back pain. Skin: Negative for rash. Neurological: Negative for headaches, focal weakness or numbness.  10-point ROS otherwise negative.  ____________________________________________   PHYSICAL EXAM:  VITAL SIGNS: ED Triage Vitals  Enc Vitals Group     BP 08/13/18 1117 (!) 146/101     Pulse Rate 08/13/18 1117 (!) 47     Resp 08/13/18 1117 (!) 22     Temp 08/13/18 1117 97.9 F (36.6 C)     Temp Source 08/13/18 1117 Oral     SpO2 08/13/18 1117 97 %     Pain Score 08/13/18 1056 10    Constitutional: Alert and oriented. Able to provide a full history but appears uncomfortable.  Eyes: Conjunctivae are normal.  Head: Atraumatic. Nose: No congestion/rhinnorhea. Mouth/Throat: Mucous membranes are moist.  Neck: No stridor.   Cardiovascular: Normal rate, regular rhythm. Good peripheral circulation. Grossly normal heart sounds.   Respiratory: Normal respiratory effort.  No retractions. Lungs CTAB. Gastrointestinal: Soft with focal epigastric tenderness. No RUQ tenderness or lower abdominal tenderness. No rebound or guarding. No distention.  Musculoskeletal: No lower extremity tenderness nor edema. No gross deformities of extremities. Neurologic:  Normal speech and language. No gross focal neurologic deficits are appreciated.  Skin:  Skin is warm, dry and intact. No rash noted.  ____________________________________________   LABS (all labs ordered are listed, but only abnormal results are displayed)  Labs Reviewed  COMPREHENSIVE METABOLIC PANEL - Abnormal; Notable for the following components:      Result  Value   Potassium 3.2 (*)    Glucose, Bld 171 (*)    All other components within normal limits  LIPASE, BLOOD  CBC  URINALYSIS, ROUTINE W REFLEX MICROSCOPIC  I-STAT TROPONIN, ED   ____________________________________________  EKG   EKG Interpretation  Date/Time:  Thursday August 13 2018 10:49:12 EDT Ventricular Rate:  55 PR Interval:    QRS Duration: 82 QT  Interval:  470 QTC Calculation: 449 R Axis:   88 Text Interpretation:  Sinus bradycardia Left ventricular hypertrophy Cannot rule out Septal infarct , age undetermined T wave abnormality, consider inferior ischemia Abnormal ECG No STEMI.  Confirmed by Alona Bene [phone]) on 08/13/2018 12:30:26 PM       ____________________________________________  RADIOLOGY  Dg Abdomen Acute W/chest  Result Date: 08/13/2018 CLINICAL DATA:  Abdominal pain, nausea, vomiting, diarrhea EXAM: DG ABDOMEN ACUTE W/ 1V CHEST COMPARISON:  None. FINDINGS: There is no evidence of dilated bowel loops or free intraperitoneal air. No radiopaque calculi or other significant radiographic abnormality is seen. Heart size and mediastinal contours are within normal limits. Both lungs are clear. IMPRESSION: Negative abdominal radiographs.  No acute cardiopulmonary disease. Electronically Signed   By: Elige Ko   On: 08/13/2018 13:37    ____________________________________________   PROCEDURES  Procedure(s) performed:   Procedures  None ____________________________________________   INITIAL IMPRESSION / ASSESSMENT AND PLAN / ED COURSE  Pertinent labs & imaging results that were available during my care of the patient were reviewed by me and considered in my medical decision making (see chart for details).  Patient presents to the emergency department with severe epigastric abdominal pain with nausea and vomiting.  This seems most consistent with gastroparesis. Doubt ACS. Non-concerning abdominal exam.   03:00 PM Patient labs and plain film reviewed. Patient symptoms improved with Haldol. Tolerating PO. Plan for discharge. Provided contact information for local GI.   At this time, I do not feel there is any life-threatening condition present. I have reviewed and discussed all results (EKG, imaging, lab, urine as appropriate), exam findings with patient. I have reviewed nursing notes and appropriate previous  records.  I feel the patient is safe to be discharged home without further emergent workup. Discussed usual and customary return precautions. Patient and family (if present) verbalize understanding and are comfortable with this plan.  Patient will follow-up with their primary care provider. If they do not have a primary care provider, information for follow-up has been provided to them. All questions have been answered.  ____________________________________________  FINAL CLINICAL IMPRESSION(S) / ED DIAGNOSES  Final diagnoses:  Epigastric pain  Non-intractable vomiting with nausea, unspecified vomiting type     MEDICATIONS GIVEN DURING THIS VISIT:  Medications  haloperidol lactate (HALDOL) injection 5 mg (5 mg Intravenous Given 08/13/18 1223)  famotidine (PEPCID) IVPB 20 mg premix (0 mg Intravenous Stopped 08/13/18 1454)  sodium chloride 0.9 % bolus 1,000 mL (0 mLs Intravenous Stopped 08/13/18 1454)     NEW OUTPATIENT MEDICATIONS STARTED DURING THIS VISIT:  New Prescriptions   DICYCLOMINE (BENTYL) 20 MG TABLET    Take 1 tablet (20 mg total) by mouth 3 (three) times daily as needed for spasms.   METOCLOPRAMIDE (REGLAN) 10 MG TABLET    Take 1 tablet (10 mg total) by mouth every 8 (eight) hours as needed for nausea or vomiting.   SUCRALFATE (CARAFATE) 1 G TABLET    Take 1 tablet (1 g total) by mouth 4 (four) times daily -  with meals and at bedtime for  7 days.    Note:  This document was prepared using Dragon voice recognition software and may include unintentional dictation errors.  Alona Bene, MD Emergency Medicine    Long, Arlyss Repress, MD 08/13/18 1520

## 2018-08-13 NOTE — ED Notes (Signed)
Patient verbalizes understanding of discharge instructions. Opportunity for questioning and answers were provided. Armband removed by staff, pt discharged from ED via wheelchair to his home with family.

## 2018-08-13 NOTE — Discharge Instructions (Signed)

## 2018-08-13 NOTE — ED Notes (Signed)
Pt given graham crackers and drink. Pt advised to eat and drink.

## 2018-08-13 NOTE — ED Triage Notes (Signed)
Pt came in with his mother this morning experience GI symptoms. He reports he is freezing, sweat noted to brow. Mother with similar symptoms.

## 2021-06-03 ENCOUNTER — Encounter (HOSPITAL_BASED_OUTPATIENT_CLINIC_OR_DEPARTMENT_OTHER): Payer: Self-pay | Admitting: Emergency Medicine

## 2021-06-03 ENCOUNTER — Emergency Department (HOSPITAL_BASED_OUTPATIENT_CLINIC_OR_DEPARTMENT_OTHER): Payer: Self-pay

## 2021-06-03 ENCOUNTER — Other Ambulatory Visit: Payer: Self-pay

## 2021-06-03 ENCOUNTER — Emergency Department (HOSPITAL_BASED_OUTPATIENT_CLINIC_OR_DEPARTMENT_OTHER)
Admission: EM | Admit: 2021-06-03 | Discharge: 2021-06-03 | Disposition: A | Discharge status: Home / Self Care | Payer: Self-pay | Attending: Emergency Medicine | Admitting: Emergency Medicine

## 2021-06-03 DIAGNOSIS — Z79899 Other long term (current) drug therapy: Secondary | ICD-10-CM | POA: Insufficient documentation

## 2021-06-03 DIAGNOSIS — R1033 Periumbilical pain: Secondary | ICD-10-CM | POA: Insufficient documentation

## 2021-06-03 DIAGNOSIS — R6883 Chills (without fever): Secondary | ICD-10-CM | POA: Insufficient documentation

## 2021-06-03 DIAGNOSIS — R61 Generalized hyperhidrosis: Secondary | ICD-10-CM | POA: Insufficient documentation

## 2021-06-03 DIAGNOSIS — R109 Unspecified abdominal pain: Secondary | ICD-10-CM

## 2021-06-03 DIAGNOSIS — Z87442 Personal history of urinary calculi: Secondary | ICD-10-CM | POA: Insufficient documentation

## 2021-06-03 DIAGNOSIS — R112 Nausea with vomiting, unspecified: Secondary | ICD-10-CM | POA: Insufficient documentation

## 2021-06-03 DIAGNOSIS — K219 Gastro-esophageal reflux disease without esophagitis: Secondary | ICD-10-CM | POA: Insufficient documentation

## 2021-06-03 DIAGNOSIS — R1013 Epigastric pain: Secondary | ICD-10-CM | POA: Insufficient documentation

## 2021-06-03 LAB — RAPID URINE DRUG SCREEN, HOSP PERFORMED
Amphetamines: NOT DETECTED
Barbiturates: NOT DETECTED
Benzodiazepines: NOT DETECTED
Cocaine: NOT DETECTED
Opiates: NOT DETECTED
Tetrahydrocannabinol: POSITIVE — AB

## 2021-06-03 LAB — COMPREHENSIVE METABOLIC PANEL
ALT: 20 U/L (ref 0–44)
AST: 27 U/L (ref 15–41)
Albumin: 4.2 g/dL (ref 3.5–5.0)
Alkaline Phosphatase: 67 U/L (ref 38–126)
Anion gap: 11 (ref 5–15)
BUN: 9 mg/dL (ref 6–20)
CO2: 25 mmol/L (ref 22–32)
Calcium: 9.1 mg/dL (ref 8.9–10.3)
Chloride: 106 mmol/L (ref 98–111)
Creatinine, Ser: 1.05 mg/dL (ref 0.61–1.24)
GFR, Estimated: >60 mL/min (ref >60)
Glucose, Bld: 153 mg/dL — ABNORMAL HIGH (ref 70–99)
Potassium: 2.8 mmol/L — ABNORMAL LOW (ref 3.5–5.1)
Sodium: 142 mmol/L (ref 135–145)
Total Bilirubin: 0.5 mg/dL (ref 0.3–1.2)
Total Protein: 8 g/dL (ref 6.5–8.1)

## 2021-06-03 LAB — CBC WITH DIFFERENTIAL/PLATELET
Abs Immature Granulocytes: 0.02 10*3/uL (ref 0.00–0.07)
Basophils Absolute: 0 10*3/uL (ref 0.0–0.1)
Basophils Relative: 0 %
Eosinophils Absolute: 0.1 10*3/uL (ref 0.0–0.5)
Eosinophils Relative: 1 %
HCT: 43.4 % (ref 39.0–52.0)
Hemoglobin: 14.8 g/dL (ref 13.0–17.0)
Immature Granulocytes: 0 %
Lymphocytes Relative: 26 %
Lymphs Abs: 2.1 10*3/uL (ref 0.7–4.0)
MCH: 31.3 pg (ref 26.0–34.0)
MCHC: 34.1 g/dL (ref 30.0–36.0)
MCV: 91.8 fL (ref 80.0–100.0)
Monocytes Absolute: 0.4 10*3/uL (ref 0.1–1.0)
Monocytes Relative: 5 %
Neutro Abs: 5.5 10*3/uL (ref 1.7–7.7)
Neutrophils Relative %: 68 %
Platelets: 290 10*3/uL (ref 150–400)
RBC: 4.73 MIL/uL (ref 4.22–5.81)
RDW: 12.8 % (ref 11.5–15.5)
WBC: 8.1 10*3/uL (ref 4.0–10.5)
nRBC: 0 % (ref 0.0–0.2)

## 2021-06-03 LAB — URINALYSIS, ROUTINE W REFLEX MICROSCOPIC
Bilirubin Urine: NEGATIVE
Glucose, UA: 100 mg/dL — AB
Ketones, ur: NEGATIVE mg/dL
Leukocytes,Ua: NEGATIVE
Nitrite: NEGATIVE
Protein, ur: NEGATIVE mg/dL
Specific Gravity, Urine: 1.025 (ref 1.005–1.030)
pH: 7.5 (ref 5.0–8.0)

## 2021-06-03 LAB — URINALYSIS, MICROSCOPIC (REFLEX)
Squamous Epithelial / LPF: NONE SEEN (ref 0–5)
WBC, UA: NONE SEEN WBC/hpf (ref 0–5)

## 2021-06-03 LAB — LACTIC ACID, PLASMA
Lactic Acid, Venous: 2.4 mmol/L (ref 0.5–1.9)
Lactic Acid, Venous: 2.8 mmol/L (ref 0.5–1.9)

## 2021-06-03 LAB — LIPASE, BLOOD: Lipase: 28 U/L (ref 11–51)

## 2021-06-03 LAB — TROPONIN I (HIGH SENSITIVITY): Troponin I (High Sensitivity): 4 ng/L (ref <18)

## 2021-06-03 MED ORDER — SODIUM CHLORIDE 0.9 % IV BOLUS
500.0000 mL | Freq: Once | INTRAVENOUS | Status: AC
  Administered 2021-06-03: 500 mL via INTRAVENOUS

## 2021-06-03 MED ORDER — POTASSIUM CHLORIDE CRYS ER 20 MEQ PO TBCR
40.0000 meq | EXTENDED_RELEASE_TABLET | Freq: Once | ORAL | Status: AC
  Administered 2021-06-03: 40 meq via ORAL
  Filled 2021-06-03: qty 2

## 2021-06-03 MED ORDER — DICYCLOMINE HCL 20 MG PO TABS: 20.0000 mg | ORAL_TABLET | Freq: Two times a day (BID) | ORAL | 0 refills | Status: DC | PRN

## 2021-06-03 MED ORDER — IOHEXOL 300 MG/ML  SOLN
100.0000 mL | Freq: Once | INTRAMUSCULAR | Status: AC | PRN
  Administered 2021-06-03: 100 mL via INTRAVENOUS

## 2021-06-03 MED ORDER — ONDANSETRON 4 MG PO TBDP: 4.0000 mg | ORAL_TABLET | Freq: Three times a day (TID) | ORAL | 0 refills | Status: DC | PRN

## 2021-06-03 MED ORDER — HYDROCODONE-ACETAMINOPHEN 5-325 MG PO TABS
1.0000 | ORAL_TABLET | Freq: Once | ORAL | Status: AC
  Administered 2021-06-03: 1 via ORAL
  Filled 2021-06-03: qty 1

## 2021-06-03 MED ORDER — PANTOPRAZOLE SODIUM 40 MG IV SOLR
40.0000 mg | Freq: Once | INTRAVENOUS | Status: AC
  Administered 2021-06-03: 40 mg via INTRAVENOUS
  Filled 2021-06-03: qty 40

## 2021-06-03 MED ORDER — POTASSIUM CHLORIDE 10 MEQ/100ML IV SOLN
10.0000 meq | Freq: Once | INTRAVENOUS | Status: AC
  Administered 2021-06-03: 10 meq via INTRAVENOUS
  Filled 2021-06-03: qty 100

## 2021-06-03 MED ORDER — SODIUM CHLORIDE 0.9 % IV BOLUS
1000.0000 mL | Freq: Once | INTRAVENOUS | Status: AC
  Administered 2021-06-03: 1000 mL via INTRAVENOUS

## 2021-06-03 MED ORDER — DIPHENHYDRAMINE HCL 50 MG/ML IJ SOLN
12.5000 mg | Freq: Once | INTRAMUSCULAR | Status: AC
  Administered 2021-06-03: 12.5 mg via INTRAVENOUS
  Filled 2021-06-03: qty 1

## 2021-06-03 MED ORDER — METOCLOPRAMIDE HCL 10 MG PO TABS: 10.0000 mg | ORAL_TABLET | Freq: Four times a day (QID) | ORAL | 0 refills | Status: DC | PRN

## 2021-06-03 MED ORDER — METOCLOPRAMIDE HCL 5 MG/ML IJ SOLN
10.0000 mg | Freq: Once | INTRAMUSCULAR | Status: AC
  Administered 2021-06-03: 10 mg via INTRAVENOUS
  Filled 2021-06-03: qty 2

## 2021-06-03 MED ORDER — HALOPERIDOL LACTATE 5 MG/ML IJ SOLN
5.0000 mg | Freq: Once | INTRAMUSCULAR | Status: AC
  Administered 2021-06-03: 5 mg via INTRAVENOUS
  Filled 2021-06-03: qty 1

## 2021-06-03 NOTE — Discharge Instructions (Addendum)
Your comprehensive work-up today is very reassuring.  Suspect that your nonspecific abdominal pain is likely related to gastroparesis, as it has been diagnosed in the past.  I am glad that you are now feeling improved.  I have prescribed you Bentyl, Reglan, and Zofran which can take as needed for symptom control at home.  The Zofran will help with nausea symptoms.  Bentyl and Reglan will help with your abdominal cramping/pain.  I referred you to [facility].  Please call to get established with a primary care provider.  You need to have a colonoscopy and other preventive health screenings.  I will also like you to be seen by gastroenterologist given your recurrent bouts of nonspecific abdominal pain.  In the interim, please avoid fried foods as that has been a trigger for you.  Return to the ER seek immediate medical attention should you experience any new or worsening symptoms.

## 2021-06-03 NOTE — ED Notes (Signed)
Pt remains diaphoretic, writhing in pain, old sheets removed as they were wet, new warm blankets provided. Pt complains he is cold. Afebrile with rectal temp

## 2021-06-03 NOTE — ED Provider Notes (Signed)
[facility] EMERGENCY DEPARTMENT Provider Note   CSN: [id number] Arrival date & time: 06/03/21  1322     History Chief Complaint  Patient presents with   Abdominal Pain    Noah Avila is a 60 y.o. male with past medical history significant for gastroparesis, kidney stones, and GERD who presents the ED with complaints of abdominal pain.  On my examination, patient is wrapped up in blankets.  He is diaphoretic and shivering, complaining of chills.  He states that last evening he ate fried seafood and this morning he woke up with severe periumbilical and epigastric abdominal pain.  I asked him if he has had this previously, he stated yes.  He had been seen in the past couple of years for similar epigastric pain with associated nausea and vomiting.  He had improved with Haldol and diagnosed with gastroparesis.  He is uncertain if he has ever seen a gastroenterologist.  He lives with his mother who has been asymptomatic.  He has had multiple episodes of yellowish emesis.  Denies any hematemesis.  He had a bowel movement this morning, felt normal.  He did not look to see if there was any bleeding or melena.  He is asking for more blankets in addition to pain medication and nausea medication.  He denies any recent fevers, recent illness, back pain, chest pain or shortness of breath, room spinning dizziness, or urinary symptoms.  He also adamantly denies any alcohol or illicit drug use.  CT angio chest abdomen pelvis dissection study obtained in 2018 was without any vascular abnormalities.  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 Authorizing Provider  dicyclomine (BENTYL) 20 MG tablet Take 1 tablet (20 mg total) by mouth 2 (two) times daily as needed for spasms. 06/03/21  Yes Lorelee New, PA-C  metoCLOPramide (REGLAN) 10 MG tablet Take 1 tablet (10 mg total) by mouth every 6 (six) hours as needed for nausea (Pain). 06/03/21  Yes Lorelee New, PA-C  ondansetron (ZOFRAN ODT) 4 MG disintegrating tablet Take 1 tablet (4 mg total) by mouth every 8 (eight) hours as needed for nausea or vomiting. 06/03/21  Yes Lorelee New, PA-C  famotidine (PEPCID) 20 MG tablet Take 1 tablet (20 mg total) by mouth 2 (two) times daily. Patient not taking: Reported on 08/13/2018 05/21/17   Jaynie Crumble, PA-C  morphine (MSIR) 15 MG tablet Take 1  tablet (15 mg total) by mouth every 4 (four) hours as needed for severe pain. Patient not taking: Reported on 08/13/2018 01/09/17   Melene Plan, DO  pantoprazole (PROTONIX) 40 MG tablet Take 1 tablet (40 mg total) by mouth daily. Patient not taking: Reported on 08/13/2018 05/21/17   Jaynie Crumble, PA-C  promethazine (PHENERGAN) 12.5 MG tablet Take 1 tablet (12.5 mg total) by mouth every 6 (six) hours as needed for nausea or vomiting. Patient not taking: Reported on 08/13/2018 05/21/17   Jaynie Crumble, PA-C  sucralfate (CARAFATE) 1 g tablet Take 1 tablet (1 g total) by mouth 4 (four) times daily -  with meals and at bedtime for 7 days. 08/13/18 08/20/18   Long, Arlyss Repress, MD    Allergies    Patient has no known allergies.  Review of Systems   Review of Systems  All other systems reviewed and are negative.  Physical Exam Updated Vital Signs BP 110/61   Pulse 81   Temp 98.8 F (37.1 C) (Rectal)   Resp 18   Ht 5\' 9"  (1.753 m)   Wt 79.4 kg   SpO2 98%   BMI 25.84 kg/m   Physical Exam Vitals and nursing note reviewed. Exam conducted with a chaperone present.  Constitutional:      Appearance: He is ill-appearing and diaphoretic.  HENT:     Head: Normocephalic and atraumatic.  Eyes:     General: No scleral icterus.    Conjunctiva/sclera: Conjunctivae normal.  Cardiovascular:     Rate and Rhythm: Normal rate.     Pulses: Normal pulses.  Pulmonary:     Effort: Pulmonary effort is normal. No respiratory distress.  Abdominal:     General: Abdomen is flat. There is no distension.     Palpations: Abdomen is soft. There is no mass.     Tenderness: There is abdominal tenderness. There is no guarding.     Hernia: No hernia is present.  Neurological:     Mental Status: He is alert.     GCS: GCS eye subscore is 4. GCS verbal subscore is 5. GCS motor subscore is 6.  Psychiatric:        Mood and Affect: Mood normal.        Behavior: Behavior normal.        Thought Content: Thought content normal.    ED Results / Procedures / Treatments   Labs (all labs ordered are listed, but only abnormal results are displayed) Labs Reviewed  COMPREHENSIVE METABOLIC PANEL - Abnormal; Notable for the following components:      Result Value   Potassium 2.8 (*)    Glucose, Bld 153 (*)    All other components within normal limits  URINALYSIS, ROUTINE W REFLEX MICROSCOPIC - Abnormal; Notable for the following components:   Color, Urine STRAW (*)    Glucose, UA 100 (*)    Hgb urine dipstick SMALL (*)    All other components within normal limits  LACTIC ACID, PLASMA - Abnormal; Notable for the following components:   Lactic Acid, Venous 2.4 (*)     All other components within normal limits  LACTIC ACID, PLASMA - Abnormal; Notable for the following components:   Lactic Acid, Venous 2.8 (*)    All other components within normal limits  URINALYSIS, MICROSCOPIC (REFLEX) - Abnormal; Notable for the following components:   Bacteria, UA RARE (*)    All other components within normal limits  RAPID URINE DRUG SCREEN, HOSP PERFORMED - Abnormal; Notable for  the following components:   Tetrahydrocannabinol POSITIVE (*)    All other components within normal limits  CBC WITH DIFFERENTIAL/PLATELET  LIPASE, BLOOD  TROPONIN I (HIGH SENSITIVITY)    EKG EKG Interpretation  Date/Time:  Sunday June 03 2021 13:53:50 EDT Ventricular Rate:  57 PR Interval:  180 QRS Duration: 81 QT Interval:  431 QTC Calculation: 420 R Axis:   69 Text Interpretation: Sinus rhythm Left ventricular hypertrophy Abnrm T, consider ischemia, anterolateral lds Anterior ST elevation, probably due to LVH Baseline wander in lead(s) II III aVR aVF V1 V2 V3 V4 V5 V6 very poor data quality - please discard Confirmed by Pricilla LovelessGoldston, Scott [phone]([id number]) on 06/03/2021 2:28:42 PM  Radiology CT ABDOMEN PELVIS W CONTRAST  Result Date: 06/03/2021 CLINICAL DATA:  Abdominal pain EXAM: CT ABDOMEN AND PELVIS WITH CONTRAST TECHNIQUE: Multidetector CT imaging of the abdomen and pelvis was performed using the standard protocol following bolus administration of intravenous contrast. CONTRAST:  100mL OMNIPAQUE IOHEXOL 300 MG/ML  SOLN COMPARISON:  January 09, 2017 FINDINGS: Lower chest: Unchanged 3 mm LEFT lower lobe pulmonary nodule since 2018, consistent with a benign etiology (series 4, image 8). Hepatobiliary: Unremarkable appearance of the liver. Gallbladder is unremarkable. No intrahepatic or extrahepatic biliary ductal dilation. Portal vein is patent. Pancreas: Unremarkable. No pancreatic ductal dilatation or surrounding inflammatory changes. Spleen: Normal in size without focal abnormality.  Adrenals/Urinary Tract: Adrenal glands are unremarkable. Kidneys enhance symmetrically. No hydronephrosis. Subcentimeter hypodense lesions are too small to accurately characterize. Bladder is unremarkable. Stomach/Bowel: No evidence of bowel obstruction. Appendix is normal. Colon is decompressed. Vascular/Lymphatic: Atherosclerotic calcifications. No suspicious lymphadenopathy. Reproductive: Hypertrophy of the median lobe of the prostate. Prostatomegaly. Other: Fat containing umbilical hernia. Musculoskeletal: No acute or significant osseous findings. IMPRESSION: No CT etiology for acute abdominal pain identified. Normal appendix. Aortic Atherosclerosis (ICD10-I70.0). Electronically Signed   By: Meda KlinefelterStephanie  Peacock MD   On: 06/03/2021 18:06    Procedures Procedures   Medications Ordered in ED Medications  metoCLOPramide (REGLAN) injection 10 mg (10 mg Intravenous Given 06/03/21 1445)  diphenhydrAMINE (BENADRYL) injection 12.5 mg (12.5 mg Intravenous Given 06/03/21 1440)  sodium chloride 0.9 % bolus 1,000 mL (0 mLs Intravenous Stopped 06/03/21 1747)  pantoprazole (PROTONIX) injection 40 mg (40 mg Intravenous Given 06/03/21 1441)  haloperidol lactate (HALDOL) injection 5 mg (5 mg Intravenous Given 06/03/21 1609)  potassium chloride SA (KLOR-CON) CR tablet 40 mEq (40 mEq Oral Given 06/03/21 1800)  potassium chloride 10 mEq in 100 mL IVPB (0 mEq Intravenous Stopped 06/03/21 1747)  sodium chloride 0.9 % bolus 500 mL (0 mLs Intravenous Stopped 06/03/21 1747)  iohexol (OMNIPAQUE) 300 MG/ML solution 100 mL (100 mLs Intravenous Contrast Given 06/03/21 1745)  HYDROcodone-acetaminophen (NORCO/VICODIN) 5-325 MG per tablet 1 tablet (1 tablet Oral Given 06/03/21 1853)    ED Course  I have reviewed the triage vital signs and the nursing notes.  Pertinent labs & imaging results that were available during my care of the patient were reviewed by me and considered in my medical decision making (see chart for details).     MDM Rules/Calculators/A&P                          Vista DeckWilliam P Huffaker was evaluated in Emergency Department on 06/03/2021 for the symptoms described in the history of present illness. He was evaluated in the context of the global COVID-19 pandemic, which necessitated consideration that the patient might be at risk for infection with the SARS-CoV-2 virus  that causes COVID-19. Institutional protocols and algorithms that pertain to the evaluation of patients at risk for COVID-19 are in a state of rapid change based on information released by regulatory bodies including the CDC and federal and state organizations. These policies and algorithms were followed during the patient's care in the ED.  I personally reviewed patient's medical chart and all notes from triage and staff during today's encounter. I have also ordered and reviewed all labs and imaging that I felt to be medically necessary in the evaluation of this patient's complaints and with consideration of their physical exam. If needed, translation services were available and utilized.   Patient with nonspecific abdominal pain that is acute, woke him from his sleep this morning.  Reviewed CTA dissection study in 2018 which was without any vascular abnormalities.  Will initiate treatment with Reglan, Benadryl, IVF, and IV Protonix.  We will need to repeat his EKG given wandering baseline, likely due to patient discomfort.  I reviewed his medical record and his blood pressure here in the ED is markedly higher than it had been in the previous years.  His initial blood pressure was reasonable, but repeat was elevated to 221/104.  Suspect that this is in context of his significant discomfort.  This was obtained immediately after medications were delivered.  We will recheck.  Troponin within normal limits at 4.  He states that his symptoms began over 9 hours prior to obtaining the blood.  He is denying any chest pain or shortness of breath.  Do not need to  trend.  CBC without evidence of anemia or leukocytosis concerning for infection.  Lipase is within normal limits.  CMP with mild hypokalemia to 2.8, consistent with his recent nausea and vomiting.  Lactic acid 2.4, will provide IV fluid resuscitation and recheck.  On my subsequent evaluation of patient, his blood pressure had improved to 160/90.  For what ever reason it has not been documented.  It has improved with pain control.  He states that his pain has improved from 10 out of 10 to 8 out of 10 after initial treatments, will add on Haldol.  He did not drive here.  No QT prolongation on EKG.  I then spoke with his mother who is now at bedside.  She states that this always presents the same way.  He always gets very cold, he had diaphoretic.  This is completely consistent with his previous episodes of diagnosed gastroparesis.  She states that she personally takes metoclopramide for gastroparesis and that she understands she needs to be seen by primary care provider and gastroenterology.  She would like for him to see her gastroenterologist, Dr. Eula Listen.  She will also appreciate referral to [facility] given that he is without medical insurance at this time.  I provide him with a single Vicodin given that he was still experiencing significant pain despite treatments here in the ED.  On subsequent evaluation, he finally is feeling improved.  He feels prepared for discharge.  His mother at bedside will continue to care for him at home.  We will discharge him home with continued Reglan and Bentyl which he has tolerated well in the past.  We will also refer him to gastroenterology.  ER return precautions discussed.  I emphasized the importance of him returning if his symptoms worsen.  Patient and mother voiced understanding and are agreeable to the plan.  Final Clinical Impression(s) / ED Diagnoses Final diagnoses:  Nonspecific abdominal  pain    Rx / DC Orders ED  Discharge Orders          Ordered    Ambulatory referral to Gastroenterology        06/03/21 1941    metoCLOPramide (REGLAN) 10 MG tablet  Every 6 hours PRN        06/03/21 1943    dicyclomine (BENTYL) 20 MG tablet  2 times daily PRN        06/03/21 1943    ondansetron (ZOFRAN ODT) 4 MG disintegrating tablet  Every 8 hours PRN        06/03/21 1943             Lorelee New, PA-C 06/03/21 1943    Pricilla Loveless, MD 06/04/21 [phone]

## 2021-06-03 NOTE — ED Triage Notes (Signed)
Pt arrives pov with c/o abdominal pain that started this morning. Endorses N/V/D. P denies fever. Pt wrapped in blanket, to triage in wheelchair.

## 2021-09-07 ENCOUNTER — Other Ambulatory Visit: Payer: Self-pay

## 2021-09-07 ENCOUNTER — Ambulatory Visit
Admission: RE | Admit: 2021-09-07 | Discharge: 2021-09-07 | Disposition: A | Discharge status: Home / Self Care | Payer: Self-pay | Source: Ambulatory Visit | Attending: Emergency Medicine | Admitting: Emergency Medicine

## 2021-09-07 ENCOUNTER — Encounter (HOSPITAL_BASED_OUTPATIENT_CLINIC_OR_DEPARTMENT_OTHER): Payer: Self-pay | Admitting: *Deleted

## 2021-09-07 ENCOUNTER — Emergency Department (HOSPITAL_BASED_OUTPATIENT_CLINIC_OR_DEPARTMENT_OTHER)
Admission: EM | Admit: 2021-09-07 | Discharge: 2021-09-07 | Disposition: A | Discharge status: Home / Self Care | Payer: Self-pay | Attending: Emergency Medicine | Admitting: Emergency Medicine

## 2021-09-07 VITALS — BP 164/101 | HR 78 | Temp 98.3°F | Resp 20

## 2021-09-07 DIAGNOSIS — I10 Essential (primary) hypertension: Secondary | ICD-10-CM | POA: Insufficient documentation

## 2021-09-07 DIAGNOSIS — R030 Elevated blood-pressure reading, without diagnosis of hypertension: Secondary | ICD-10-CM

## 2021-09-07 DIAGNOSIS — Z79899 Other long term (current) drug therapy: Secondary | ICD-10-CM | POA: Insufficient documentation

## 2021-09-07 DIAGNOSIS — R9431 Abnormal electrocardiogram [ECG] [EKG]: Secondary | ICD-10-CM

## 2021-09-07 MED ORDER — AMLODIPINE BESYLATE 2.5 MG PO TABS: 5.0000 mg | ORAL_TABLET | Freq: Every day | ORAL | 0 refills | Status: DC

## 2021-09-07 MED ORDER — AMLODIPINE BESYLATE 5 MG PO TABS
2.5000 mg | ORAL_TABLET | Freq: Once | ORAL | Status: AC
  Administered 2021-09-07: 2.5 mg via ORAL
  Filled 2021-09-07: qty 1

## 2021-09-07 MED ORDER — CLONIDINE HCL 0.1 MG PO TABS: 0.1000 mg | ORAL_TABLET | Freq: Once | ORAL | Status: DC

## 2021-09-07 NOTE — ED Provider Notes (Signed)
[facility] EMERGENCY DEPARTMENT Provider Note   CSN: [id number] Arrival date & time: 09/07/21  1103     History Chief Complaint  Patient presents with   Hypertension    Noah Avila is a 60 y.o. male.  HPI 60 year old male presents with hypertension.  The patient has been checking his blood pressure at home and over the last few days it has been running high.  Varies between 160 and 180 systolic.  Diastolic always seems to be above 100.  He states before this, last week, his blood pressure was around 120 systolic.  He denies any symptoms.  He does wonder if he is a little more stressed recently as someone died within the last few weeks.  Otherwise, he denies a history of hypertension and he went to urgent care who told him he had an abnormal ECG and should come here.  He denies chest pain or shortness of breath.  Past Medical History:  Diagnosis Date   Family history of anesthesia complication    "mom has PONV"   GERD (gastroesophageal reflux disease)    Headache(784.0)    "bad ones; not regular" (07/26/2014)   Nephrolithiasis     Patient Active Problem List   Diagnosis Date Noted   Near syncope 07/26/2014   Acute gastroenteritis 07/26/2014   Rigors 07/26/2014   Wenckebach phenomenon 07/26/2014    Past Surgical History:  Procedure Laterality Date   NO PAST SURGERIES         Family History  Problem Relation Age of Onset   Coronary artery disease Mother    Lung cancer Mother    Renal Disease Father    Diabetes Father    Heart disease Father    Heart disease Brother     Social History   Tobacco Use   Smoking status: Never   Smokeless tobacco: Never  Substance Use Topics   Alcohol use: No   Drug use: No    Home Medications Prior to Admission medications   Medication Sig Start Date End Date Taking? Authorizing Provider  amLODipine (NORVASC) 2.5 MG tablet Take 2 tablets (5 mg total) by mouth daily. 09/07/21  Yes Pricilla Loveless, MD   pantoprazole (PROTONIX) 40 MG tablet Take 1 tablet (40 mg total) by mouth daily. 05/21/17  Yes Kirichenko, Tatyana, PA-C  dicyclomine (BENTYL) 20 MG tablet Take 1 tablet (20 mg total) by mouth 2 (two) times daily as needed for spasms. 06/03/21   Lorelee New, PA-C  famotidine (PEPCID) 20 MG tablet Take 1 tablet (20 mg total) by mouth 2 (two) times daily. 05/21/17   Kirichenko, Lemont Fillers, PA-C  metoCLOPramide (REGLAN) 10 MG tablet Take 1 tablet (10 mg total) by mouth every 6 (six) hours as needed for nausea (Pain). 06/03/21   Lorelee New, PA-C  morphine (MSIR) 15 MG tablet Take 1 tablet (15 mg total) by mouth every 4 (four) hours as needed for severe pain. Patient not taking: No sig reported 01/09/17   Melene Plan, DO  ondansetron (ZOFRAN ODT) 4 MG disintegrating tablet Take 1 tablet (4 mg total) by mouth every 8 (eight) hours as needed for nausea or vomiting. 06/03/21   Lorelee New, PA-C  promethazine (PHENERGAN) 12.5 MG tablet Take 1 tablet (12.5 mg total) by mouth every 6 (six) hours as needed for nausea or vomiting. Patient not taking: No sig reported 05/21/17   Kirichenko, Lemont Fillers, PA-C  sucralfate (CARAFATE) 1 g tablet Take 1 tablet (1 g total) by mouth 4 (four)  times daily -  with meals and at bedtime for 7 days. 08/13/18 08/20/18  Long, Arlyss Repress, MD    Allergies    Patient has no known allergies.  Review of Systems   Review of Systems  Eyes:  Negative for visual disturbance.  Respiratory:  Negative for shortness of breath.   Cardiovascular:  Negative for chest pain.  Neurological:  Negative for weakness, numbness and headaches.  All other systems reviewed and are negative.  Physical Exam Updated Vital Signs BP (!) 164/104   Pulse (!) 58   Temp 98 F (36.7 C) (Oral)   Resp 16   Ht 5\' 9"  (1.753 m)   Wt 79.4 kg   SpO2 98%   BMI 25.85 kg/m   Physical Exam Vitals and nursing note reviewed.  Constitutional:      General: He is not in acute distress.    Appearance: He is  well-developed. He is not ill-appearing or diaphoretic.  HENT:     Head: Normocephalic and atraumatic.     Right Ear: External ear normal.     Left Ear: External ear normal.     Nose: Nose normal.  Eyes:     General:        Right eye: No discharge.        Left eye: No discharge.     Extraocular Movements: Extraocular movements intact.     Pupils: Pupils are equal, round, and reactive to light.  Cardiovascular:     Rate and Rhythm: Normal rate and regular rhythm.     Heart sounds: Normal heart sounds.  Pulmonary:     Effort: Pulmonary effort is normal.     Breath sounds: Normal breath sounds.  Abdominal:     Palpations: Abdomen is soft.     Tenderness: There is no abdominal tenderness.  Musculoskeletal:     Cervical back: Neck supple.  Skin:    General: Skin is warm and dry.  Neurological:     Mental Status: He is alert.     Comments: CN 3-12 grossly intact. 5/5 strength in all 4 extremities. Grossly normal sensation. Normal finger to nose.   Psychiatric:        Mood and Affect: Mood is not anxious.    ED Results / Procedures / Treatments   Labs (all labs ordered are listed, but only abnormal results are displayed) Labs Reviewed - No data to display  EKG EKG Interpretation  Date/Time:  Friday September 07 2021 13:05:00 EDT Ventricular Rate:  54 PR Interval:  174 QRS Duration: 84 QT Interval:  422 QTC Calculation: 400 R Axis:   76 Text Interpretation: Sinus rhythm Left ventricular hypertrophy ST elevation likely LVH similar to earlier in the day Confirmed by 07-18-2005 [id number] on 09/07/2021 1:24:34 PM  Radiology No results found.  Procedures Procedures   Medications Ordered in ED Medications  amLODipine (NORVASC) tablet 2.5 mg (2.5 mg Oral Given 09/07/21 1328)    ED Course  I have reviewed the triage vital signs and the nursing notes.  Pertinent labs & imaging results that were available during my care of the patient were reviewed by me and considered  in my medical decision making (see chart for details).    MDM Rules/Calculators/A&P                           Patient is asymptomatic.  His ECG is more consistent with LVH than ST elevation MI and he has  no chest pain symptoms.  Labs were drawn at urgent care and given he otherwise has no complaints I think it is not needed to repeat the labs currently.  Urgent care indicated to the patient I will follow him up a couple weeks.  I will put him on low-dose Norvasc given he has had multiple episodes of running high in the emergency department previously and seems to be running high currently.  Otherwise, he has no signs of endorgan damage and appears stable for discharge home with return precautions. Final Clinical Impression(s) / ED Diagnoses Final diagnoses:  Hypertension, unspecified type    Rx / DC Orders ED Discharge Orders          Ordered    amLODipine (NORVASC) 2.5 MG tablet  Daily        09/07/21 1410             Pricilla Loveless, MD 09/07/21 1458

## 2021-09-07 NOTE — ED Notes (Signed)
Patient is being discharged from the Urgent Care and sent to the Emergency Department via POA . Per H. Wieters PA, patient is in need of higher level of care due to elevated BP and abnormal EKG. Patient is aware and verbalizes understanding of plan of care.  Vitals:   09/07/21 0918 09/07/21 0921  BP: (!) 170/98 (!) 164/101  Pulse: 78   Resp: 20   Temp: 98.3 F (36.8 C)   SpO2: 97%

## 2021-09-07 NOTE — ED Triage Notes (Signed)
States he was seen at [facility] this am for HTN for a few days. He does not have a hx of HTN. He had an EKG this am at [facility].

## 2021-09-07 NOTE — ED Triage Notes (Signed)
Pt reports having elevated blood pressure for a few days. He does check his BP at home, he denies vision changes and headaches. He states his BP has been running up to 160s/112s.

## 2021-09-07 NOTE — Discharge Instructions (Signed)
Please go to emergency room for further evaluation.

## 2021-09-07 NOTE — ED Provider Notes (Signed)
[facility]    CSN: [id number] Arrival date & time: 09/07/21  0857      History   Chief Complaint Chief Complaint  Patient presents with   Hypertension    HPI Noah Avila is a 60 y.o. male presenting today for evaluation of elevated blood pressure.  Reports pressure over the past 3 days has been elevated to approximately 180 systolic and 110 diastolic.  Reports baseline pressure is typically 120/80 and last noted to be normal within the past week.  He denies any symptoms of headache, vision changes, difficulty speaking, dizziness, lightheadedness, one-sided weakness, chest pain or shortness of breath prompting checking his pressure, denies any of the symptoms at present or in the past few days.  He reports significant family history of heart disease.  He reports increased emotional stress in life due to multiple deaths.  Denies history of hypertension or previously needing medicine.  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 Authorizing Provider  dicyclomine (BENTYL) 20 MG tablet Take 1 tablet (20 mg total) by mouth 2 (two) times daily as needed for spasms. 06/03/21   Lorelee New, PA-C  famotidine (PEPCID) 20 MG tablet Take 1 tablet (20 mg total) by mouth 2 (two) times daily. Patient not taking: Reported on 08/13/2018 05/21/17   Jaynie Crumble, PA-C  metoCLOPramide (REGLAN) 10 MG tablet Take 1 tablet (10 mg total) by mouth  every 6 (six) hours as needed for nausea (Pain). 06/03/21   Lorelee New, PA-C  morphine (MSIR) 15 MG tablet Take 1 tablet (15 mg total) by mouth every 4 (four) hours as needed for severe pain. Patient not taking: Reported on 08/13/2018 01/09/17   Melene Plan, DO  ondansetron (ZOFRAN ODT) 4 MG disintegrating tablet Take 1 tablet (4 mg total) by mouth every 8 (eight) hours as needed for nausea or vomiting. 06/03/21   Lorelee New, PA-C  pantoprazole (PROTONIX) 40 MG tablet Take 1 tablet (40 mg total) by mouth daily. Patient not taking: Reported on 08/13/2018 05/21/17   Jaynie Crumble, PA-C  promethazine (PHENERGAN) 12.5 MG tablet Take 1 tablet (12.5 mg total) by mouth every 6 (six) hours as needed for nausea or vomiting. Patient not taking: Reported on 08/13/2018 05/21/17   Jaynie Crumble, PA-C  sucralfate (CARAFATE) 1 g tablet Take 1 tablet (1 g total) by mouth 4 (four) times daily -  with meals and at bedtime for 7 days. 08/13/18 08/20/18  Long, Arlyss Repress, MD    Family History Family History  Problem Relation Age of Onset   Coronary artery disease Mother    Lung cancer Mother    Renal Disease Father    Diabetes Father    Heart disease Father    Heart disease Brother     Social History Social History   Tobacco Use   Smoking status: Never   Smokeless tobacco:  Never  Substance Use Topics   Alcohol use: No   Drug use: No     Allergies   Patient has no known allergies.   Review of Systems Review of Systems  Constitutional:  Negative for fatigue and fever.  HENT:  Negative for congestion, sinus pressure and sore throat.   Eyes:  Negative for photophobia, pain and visual disturbance.  Respiratory:  Negative for cough and shortness of breath.   Cardiovascular:  Negative for chest pain.  Gastrointestinal:  Negative for abdominal pain, nausea and vomiting.  Genitourinary:  Negative for decreased urine volume and hematuria.  Musculoskeletal:  Negative for myalgias, neck  pain and neck stiffness.  Neurological:  Negative for dizziness, syncope, facial asymmetry, speech difficulty, weakness, light-headedness, numbness and headaches.    Physical Exam Triage Vital Signs ED Triage Vitals  Enc Vitals Group     BP      Pulse      Resp      Temp      Temp src      SpO2      Weight      Height      Head Circumference      Peak Flow      Pain Score      Pain Loc      Pain Edu?      Excl. in GC?    No data found.  Updated Vital Signs BP (!) 164/101 (BP Location: Left Arm)   Pulse 78   Temp 98.3 F (36.8 C) (Oral)   Resp 20   SpO2 97%   Visual Acuity Right Eye Distance:   Left Eye Distance:   Bilateral Distance:    Right Eye Near:   Left Eye Near:    Bilateral Near:     Physical Exam Vitals and nursing note reviewed.  Constitutional:      Appearance: He is well-developed.     Comments: No acute distress  HENT:     Head: Normocephalic and atraumatic.     Nose: Nose normal.  Eyes:     Conjunctiva/sclera: Conjunctivae normal.  Cardiovascular:     Rate and Rhythm: Normal rate and regular rhythm.  Pulmonary:     Effort: Pulmonary effort is normal. No respiratory distress.     Comments: Breathing comfortably at rest, CTABL, no wheezing, rales or other adventitious sounds auscultated  Abdominal:     General: There is no distension.  Musculoskeletal:        General: Normal range of motion.     Cervical back: Neck supple.  Skin:    General: Skin is warm and dry.  Neurological:     General: No focal deficit present.     Mental Status: He is alert and oriented to person, place, and time. Mental status is at baseline.     Cranial Nerves: No cranial nerve deficit.     Motor: No weakness.     Gait: Gait normal.     UC Treatments / Results  Labs (all labs ordered are listed, but only abnormal results are displayed) Labs Reviewed  COMPREHENSIVE METABOLIC PANEL  CBC    EKG   Radiology No results  found.  Procedures Procedures (including critical care time)  Medications Ordered in UC Medications - No data to display  Initial Impression / Assessment and Plan / UC Course  I have reviewed the triage vital signs and the nursing notes.  Pertinent labs & imaging results that were available during my care  of the patient were reviewed by me and considered in my medical decision making (see chart for details).  Clinical Course as of 09/07/21 1052  Fri Sep 07, 2021  0945 BP rechecked 179/118 [HW]    Clinical Course User Index [HW] Rey Dansby C, PA-C    EKG normal sinus rhythm with elevations noted in V2 V3 and V4, reciprocal changes of T wave inversions noted in lead III and aVF, this does appear new from prior EKG from 2019 in chart, slight elevation present in V2- V4 from EKG earlier in 2018, but given recent increase in blood pressure with no reciprocal changes will recommend further cardiology work-up.  Advised patient to go to emergency room for further evaluation.  Currently asymptomatic.  Stable on discharge, patient sent via private vehicle.  Discussed strict return precautions. Patient verbalized understanding and is agreeable with plan.  Final Clinical Impressions(s) / UC Diagnoses   Final diagnoses:  Elevated blood pressure reading in office without diagnosis of hypertension  EKG abnormalities     Discharge Instructions      Please go to emergency room for further evaluation     ED Prescriptions   None    PDMP not reviewed this encounter.   Sharyon Cable [place] C, PA-C 09/07/21 1052

## 2021-09-08 LAB — COMPREHENSIVE METABOLIC PANEL
ALT: 19 IU/L (ref 0–44)
AST: 20 IU/L (ref 0–40)
Albumin/Globulin Ratio: 1.6 (ref 1.2–2.2)
Albumin: 4.2 g/dL (ref 3.8–4.9)
Alkaline Phosphatase: 76 IU/L (ref 44–121)
BUN/Creatinine Ratio: 11 (ref 9–20)
BUN: 12 mg/dL (ref 6–24)
Bilirubin Total: 0.3 mg/dL (ref 0.0–1.2)
CO2: 24 mmol/L (ref 20–29)
Calcium: 9.2 mg/dL (ref 8.7–10.2)
Chloride: 105 mmol/L (ref 96–106)
Creatinine, Ser: 1.13 mg/dL (ref 0.76–1.27)
Globulin, Total: 2.7 g/dL (ref 1.5–4.5)
Glucose: 86 mg/dL (ref 65–99)
Potassium: 3.9 mmol/L (ref 3.5–5.2)
Sodium: 142 mmol/L (ref 134–144)
Total Protein: 6.9 g/dL (ref 6.0–8.5)
eGFR: 76 mL/min/{1.73_m2} (ref >59)

## 2021-09-08 LAB — CBC
Hematocrit: 41.6 % (ref 37.5–51.0)
Hemoglobin: 14 g/dL (ref 13.0–17.7)
MCH: 31 pg (ref 26.6–33.0)
MCHC: 33.7 g/dL (ref 31.5–35.7)
MCV: 92 fL (ref 79–97)
Platelets: 224 10*3/uL (ref 150–450)
RBC: 4.51 x10E6/uL (ref 4.14–5.80)
RDW: 13.1 % (ref 11.6–15.4)
WBC: 6.7 10*3/uL (ref 3.4–10.8)

## 2021-11-27 NOTE — Progress Notes (Signed)
Subjective:    Noah Avila - 60 y.o. male MRN [id number]  Date of birth: [birth year]  HPI  Noah Avila is to establish care.   Current issues and/or concerns: HOSPITAL DISCHARGE FOLLOW-UP: 09/07/2021 at [facility] Emergency Department per MD note: Patient is asymptomatic.  His ECG is more consistent with LVH than ST elevation MI and he has no chest pain symptoms.  Labs were drawn at urgent care and given he otherwise has no complaints I think it is not needed to repeat the labs currently.  Urgent care indicated to the patient I will follow him up a couple weeks.  I will put him on low-dose Norvasc given he has had multiple episodes of running high in the emergency department previously and seems to be running high currently.  Otherwise, he has no signs of endorgan damage and appears stable for discharge home with return precautions.  11/29/2021: Reports ran out of Amlodipine at least 4 weeks ago. Prior to that checking blood pressures at home and was coming down.  Adherence with salt restriction (low-salt diet): []  Yes  [x]  No Exercise: Yes [x]  No []  Home Monitoring?: [x]  Yes    []  No Monitoring Frequency: [x]  Yes    []  No Smoking []  Yes [x]  No SOB? []  Yes    [x]  No Chest Pain?: []  Yes    [x]  No Leg swelling?: []  Yes    [x]  No Comments: Reports current events and commentary of people irritate him which may cause blood pressure to increase. Reports his best friend passed away last year, reports he is fine but does miss him.   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 He has never used smokeless tobacco. He reports that he does not currently use alcohol. He reports that he does not use drugs.   Family History  family history includes Coronary artery disease in his mother; Diabetes in his father; Heart disease in his brother and father; Lung cancer in his mother; Renal Disease in his father.   Medications: reviewed and updated   Objective:   Physical Exam BP (!) 149/87 (BP Location: Left Arm, Patient Position: Sitting, Cuff Size: Large)   Pulse 79   Temp 98 F (36.7 C)   Resp 18   Ht 5' 8.7" (1.745 m)   Wt 174 lb (78.9 kg)   SpO2 96%   BMI 25.92 kg/m  Physical Exam HENT:     Head: Normocephalic and atraumatic.  Eyes:     Extraocular Movements: Extraocular movements intact.     Conjunctiva/sclera: Conjunctivae normal.     Pupils: Pupils are equal, round, and reactive to light.  Cardiovascular:     Rate and Rhythm: Normal rate and regular rhythm.  Pulses: Normal pulses.     Heart sounds: Normal heart sounds.  Pulmonary:     Effort: Pulmonary effort is normal.     Breath sounds: Normal breath sounds.  Musculoskeletal:     Cervical back: Normal range of motion and neck supple.  Neurological:     General: No focal deficit present.     Mental Status: He is alert and oriented to person, place, and time.  Psychiatric:        Mood and Affect: Mood normal.        Behavior: Behavior normal.      Assessment & Plan:  1. Encounter to establish care: - Patient presents today to establish care.  - Return for annual physical examination, labs, and health maintenance. Arrive fasting meaning having no food for at least 8 hours prior to appointment. You may have only water or black coffee. Please take scheduled  medications as normal.  2. Hospital discharge follow-up: - Reviewed hospital course, current medications, ensured proper follow-up in place, and addressed concerns.    3. Essential hypertension: - Blood pressure not at goal during today's visit. Patient asymptomatic without chest pressure, chest pain, palpitations, shortness of breath, worst headache of life, and any additional red flag symptoms. - Patient without blood pressure medication for at least 4 weeks.  - Resume Amlodipine as prescribed.  - Counseled on blood pressure goal of less than 130/80, low-sodium, DASH diet, medication compliance, 150 minutes of moderate intensity exercise per week as tolerated. Discussed medication compliance, adverse effects. - Follow-up with primary provider in 2 weeks or sooner if needed.  - amLODipine (NORVASC) 2.5 MG tablet; Take 1 tablet (2.5 mg total) by mouth 2 (two) times daily.  Dispense: 60 tablet; Refill: 0  4. Gastroesophageal reflux disease, unspecified whether esophagitis present: - Omeprazole as prescribed.  - You may feel better if you: ?Avoid foods that make your symptoms worse - For some people these include coffee, chocolate, alcohol, peppermint, and fatty foods. ?Avoid late meals - Lying down with a full stomach can make reflux worse. Try to plan meals for at least 2 to 3 hours before bedtime. - Follow-up with primary provider as scheduled.  - omeprazole (PRILOSEC) 40 MG capsule; Take 1 capsule (40 mg total) by mouth daily.  Dispense: 90 capsule; Refill: 0     Patient was given clear instructions to go to Emergency Department or return to medical center if symptoms don't improve, worsen, or new problems develop.The patient verbalized understanding.  I discussed the assessment and treatment plan with the patient. The patient was provided an opportunity to ask questions and all were answered. The patient agreed with the plan and demonstrated an understanding of the instructions.   The  patient was advised to call back or seek an in-person evaluation if the symptoms worsen or if the condition fails to improve as anticipated.    Ricky Stabs, NP 11/29/2021, 11:15 AM Primary Care at [facility]

## 2021-11-29 ENCOUNTER — Ambulatory Visit (INDEPENDENT_AMBULATORY_CARE_PROVIDER_SITE_OTHER): Payer: Self-pay | Admitting: Family

## 2021-11-29 ENCOUNTER — Other Ambulatory Visit: Payer: Self-pay

## 2021-11-29 ENCOUNTER — Encounter: Payer: Self-pay | Admitting: Family

## 2021-11-29 VITALS — BP 149/87 | HR 79 | Temp 98.0°F | Resp 18 | Ht 68.7 in | Wt 174.0 lb

## 2021-11-29 DIAGNOSIS — I10 Essential (primary) hypertension: Secondary | ICD-10-CM

## 2021-11-29 DIAGNOSIS — K219 Gastro-esophageal reflux disease without esophagitis: Secondary | ICD-10-CM

## 2021-11-29 DIAGNOSIS — Z09 Encounter for follow-up examination after completed treatment for conditions other than malignant neoplasm: Secondary | ICD-10-CM

## 2021-11-29 DIAGNOSIS — Z7689 Persons encountering health services in other specified circumstances: Secondary | ICD-10-CM

## 2021-11-29 MED ORDER — OMEPRAZOLE 40 MG PO CPDR: 40.0000 mg | DELAYED_RELEASE_CAPSULE | Freq: Every day | ORAL | 0 refills | Status: DC

## 2021-11-29 MED ORDER — AMLODIPINE BESYLATE 2.5 MG PO TABS: 2.5000 mg | ORAL_TABLET | Freq: Two times a day (BID) | ORAL | 0 refills | Status: DC

## 2021-11-29 NOTE — Progress Notes (Signed)
Pt presents to establish care, has some concerns about hypertension

## 2021-11-29 NOTE — Patient Instructions (Signed)
Thank you for choosing [facility] at [facility] for your medical home!    Noah Avila was seen by Rema Fendt, NP today.   Noah Avila's primary care provider is Rema Fendt, NP.   For the best care possible,  you should try to see Noah Stabs, NP whenever you come to clinic.   We look forward to seeing you again soon!  If you have any questions about your visit today,  please call us at [phone]  Or feel free to reach your provider via MyChart.    Keeping you healthy   Get these tests Blood pressure- Have your blood pressure checked once a year by your healthcare provider.  Normal blood pressure is 120/80. Weight- Have your body mass index (BMI) calculated to screen for obesity.  BMI is a measure of body fat based on height and weight. You can also calculate your own BMI at https://www.west-esparza.com/. Cholesterol- Have your cholesterol checked regularly starting at age 55, sooner may be necessary if you have diabetes, high blood pressure, if a family member developed heart diseases at an early age or if you smoke.  Chlamydia, HIV, and other sexual transmitted disease- Get screened each year until the age of 74 then within three months of each new sexual partner. Diabetes- Have your blood sugar checked regularly if you have high blood pressure, high cholesterol, a family history of diabetes or if you are overweight.   Get these vaccines Flu shot- Every fall. Tetanus shot- Every 10 years. Menactra- Single dose; prevents meningitis.   Take these steps Don't smoke- If you do smoke, ask your healthcare provider about quitting. For tips on how to quit, go to www.smokefree.gov or call 1-800-QUIT-NOW. Be physically active- Exercise 5 days a week for at least 30 minutes.  If you are not already physically active start slow and gradually work up to 30 minutes of moderate physical activity.  Examples of moderate activity include walking briskly, mowing the yard, dancing,  swimming bicycling, etc. Eat a healthy diet- Eat a variety of healthy foods such as fruits, vegetables, low fat milk, low fat cheese, yogurt, lean meats, poultry, fish, beans, tofu, etc.  For more information on healthy eating, go to www.thenutritionsource.org Drink alcohol in moderation- Limit alcohol intake two drinks or less a day.  Never drink and drive. Dentist- Brush and floss teeth twice daily; visit your dentis twice a year. Depression-Your emotional health is as important as your physical health.  If you're feeling down, losing interest in things you normally enjoy please talk with your healthcare provider. Gun Safety- If you keep a gun in your home, keep it unloaded and with the safety lock on.  Bullets should be stored separately. Helmet use- Always wear a helmet when riding a motorcycle, bicycle, rollerblading or skateboarding. Safe sex- If you may be exposed to a sexually transmitted infection, use a condom Seat belts- Seat bels can save your life; always wear one. Smoke/Carbon Monoxide detectors- These detectors need to be installed on the appropriate level of your home.  Replace batteries at least once a year. Skin Cancer- When out in the sun, cover up and use sunscreen SPF 15 or higher. Violence- If anyone is threatening or hurting you, please tell your healthcare provider.

## 2021-12-07 NOTE — Progress Notes (Signed)
Patient ID: Noah Avila, male    DOB: Jun 03, 1961  MRN: [id number]  CC: Annual Physical Exam  Subjective: Noah Avila is a 60 y.o. male who presents for annual physical exam.   His concerns today include:   HYPERTENSION FOLLOW-UP: 11/29/2021: - Resume Amlodipine as prescribed.   12/11/2021: Doing well on current regimen. No side effects. No issues/concerns. Denies chest pain and shortness of breath.    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 20 tablet 0   famotidine (PEPCID) 20 MG tablet Take 1 tablet (20 mg total) by mouth 2 (two) times daily. 30 tablet 0   metoCLOPramide (REGLAN) 10 MG tablet Take 1 tablet (10 mg total) by mouth every 6 (six) hours as needed for nausea (Pain). 30 tablet 0   morphine (MSIR) 15 MG tablet Take 1 tablet (15 mg total) by mouth every 4 (four) hours as needed for severe pain. (Patient not taking: No sig reported) 10 tablet 0   omeprazole (PRILOSEC) 40 MG capsule Take 1 capsule (40 mg total) by mouth daily. 90 capsule 0   promethazine (PHENERGAN) 12.5 MG tablet Take 1 tablet (12.5 mg total) by mouth every 6 (six) hours as needed for nausea or vomiting. (Patient not taking: No sig reported) 15 tablet 0   sucralfate (CARAFATE) 1 g tablet Take 1 tablet (1 g total) by mouth 4 (four) times daily -  with meals and at bedtime for 7 days. 28 tablet 0   No current facility-administered medications on file prior to visit.    No Known Allergies  Social History   Socioeconomic History   Marital status: Married    Spouse name: Not on file   Number of children: Not on file   Years of education: Not on file   Highest  education level: Not on file  Occupational History   Not on file  Tobacco Use   Smoking status: Never   Smokeless tobacco: Never  Substance and Sexual Activity   Alcohol use: Not Currently   Drug use: No   Sexual activity: Yes  Other Topics Concern   Not on file  Social History Narrative   Not on file   Social Determinants of Health   Financial Resource Strain: Not on file  Food Insecurity: Not on file  Transportation Needs: Not on file  Physical Activity: Not on file  Stress: Not on file  Social Connections: Not on file  Intimate Partner Violence: Not on file    Family History  Problem Relation Age of Onset   Coronary artery disease Mother    Lung cancer Mother    Renal Disease Father    Diabetes Father    Heart disease Father    Heart disease Brother     Past Surgical History:  Procedure Laterality Date   NO PAST SURGERIES      ROS: Review of Systems Negative except as stated above  PHYSICAL EXAM: BP 130/82 (BP Location: Left Arm, Patient Position: Sitting, Cuff Size: Large)    Pulse 70    Temp 98.3 F (36.8 C)    Resp 18    Ht 5' 8.7" (  1.745 m)    Wt 171 lb (77.6 kg)    SpO2 98%    BMI 25.47 kg/m   Physical Exam HENT:     Head: Normocephalic and atraumatic.     Right Ear: Tympanic membrane, ear canal and external ear normal.     Left Ear: Tympanic membrane, ear canal and external ear normal.  Eyes:     Extraocular Movements: Extraocular movements intact.     Conjunctiva/sclera: Conjunctivae normal.     Pupils: Pupils are equal, round, and reactive to light.  Cardiovascular:     Rate and Rhythm: Normal rate and regular rhythm.     Pulses: Normal pulses.     Heart sounds: Normal heart sounds.  Pulmonary:     Effort: Pulmonary effort is normal.     Breath sounds: Normal breath sounds.  Abdominal:     General: Bowel sounds are normal.     Palpations: Abdomen is soft.  Genitourinary:    Comments: Patient declined exam. Musculoskeletal:         General: Normal range of motion.     Cervical back: Normal range of motion and neck supple.  Skin:    General: Skin is warm and dry.     Capillary Refill: Capillary refill takes less than 2 seconds.  Neurological:     General: No focal deficit present.     Mental Status: He is alert and oriented to person, place, and time.  Psychiatric:        Mood and Affect: Mood normal.        Behavior: Behavior normal.   ASSESSMENT AND PLAN: 1. Annual physical exam: - Patient presents today to establish care.  - Return for annual physical examination, labs, and health maintenance. Arrive fasting meaning having no food for at least 8 hours prior to appointment. You may have only water or black coffee. Please take scheduled medications as normal.  2. Screening for metabolic disorder: - CMP last obtained 09/07/2021.  3. Screening for deficiency anemia: - CBC last obtained 09/07/2021.  4. Diabetes mellitus screening: - Hemoglobin A1c to screen for pre-diabetes/diabetes. - Hemoglobin A1c  5. Screening cholesterol level: - Lipid panel to screen for high cholesterol.  - Lipid panel  6. Thyroid disorder screen: - TSH to check thyroid function.  - TSH  7. Need for hepatitis C screening test: - Hepatitis C antibody to screen for hepatitis C.  - Hepatitis C Antibody  8. Encounter for screening for HIV: - HIV antibody to screen for human immunodeficiency virus.  - HIV antibody (with reflex)  9. Colon cancer screening: - Screening for colon cancer by fecal occult blood. - Fecal occult blood, imunochemical([facility])  10. Essential hypertension: - Continue Amlodipine as prescribed. - Counseled on blood pressure goal of less than 130/80, low-sodium, DASH diet, medication compliance, 150 minutes of moderate intensity exercise per week as tolerated. Discussed medication compliance, adverse effects. - Follow-up with primary provider in 3 months or sooner if needed.  - amLODipine (NORVASC)  2.5 MG tablet; Take 1 tablet (2.5 mg total) by mouth 2 (two) times daily.  Dispense: 60 tablet; Refill: 2  11. Gastroesophageal reflux disease, unspecified whether esophagitis present: - Continue Omeprazole as prescribed. No refills needed as of present.  - Continue Ondansetron as prescribed.  - Follow-up with primary provider as scheduled. - ondansetron (ZOFRAN ODT) 4 MG disintegrating tablet; Take 1 tablet (4 mg total) by mouth every 8 (eight) hours as needed for nausea or vomiting.  Dispense: 20 tablet;  Refill: 2  12. Need for Tdap vaccination: - Administered today in office. - Tdap vaccine greater than or equal to 7yo IM  13. Need for shingles vaccine: - Administered today in office.  - Varicella-zoster vaccine IM  Patient was given the opportunity to ask questions.  Patient verbalized understanding of the plan and was able to repeat key elements of the plan. Patient was given clear instructions to go to Emergency Department or return to medical center if symptoms don't improve, worsen, or new problems develop.The patient verbalized understanding.   Orders Placed This Encounter  Procedures   Fecal occult blood, imunochemical([facility])   Varicella-zoster vaccine IM   Tdap vaccine greater than or equal to 7yo IM   HIV antibody (with reflex)   Hepatitis C Antibody   Lipid panel   TSH   Hemoglobin A1c     Requested Prescriptions   Signed Prescriptions Disp Refills   ondansetron (ZOFRAN ODT) 4 MG disintegrating tablet 20 tablet 2    Sig: Take 1 tablet (4 mg total) by mouth every 8 (eight) hours as needed for nausea or vomiting.   amLODipine (NORVASC) 2.5 MG tablet 60 tablet 2    Sig: Take 1 tablet (2.5 mg total) by mouth 2 (two) times daily.    Return in about 1 year (around 12/11/2022) for Physical per patient preference and 3 months or next available hypertension .  Rema Fendt, NP

## 2021-12-11 ENCOUNTER — Encounter (INDEPENDENT_AMBULATORY_CARE_PROVIDER_SITE_OTHER): Payer: Self-pay

## 2021-12-11 ENCOUNTER — Ambulatory Visit (INDEPENDENT_AMBULATORY_CARE_PROVIDER_SITE_OTHER): Payer: Self-pay | Admitting: Family

## 2021-12-11 ENCOUNTER — Encounter: Payer: Self-pay | Admitting: Family

## 2021-12-11 ENCOUNTER — Other Ambulatory Visit: Payer: Self-pay

## 2021-12-11 VITALS — BP 130/82 | HR 70 | Temp 98.3°F | Resp 18 | Ht 68.7 in | Wt 171.0 lb

## 2021-12-11 DIAGNOSIS — Z130 Encounter for screening for diseases of the blood and blood-forming organs and certain disorders involving the immune mechanism: Secondary | ICD-10-CM

## 2021-12-11 DIAGNOSIS — Z131 Encounter for screening for diabetes mellitus: Secondary | ICD-10-CM

## 2021-12-11 DIAGNOSIS — Z1329 Encounter for screening for other suspected endocrine disorder: Secondary | ICD-10-CM

## 2021-12-11 DIAGNOSIS — Z0001 Encounter for general adult medical examination with abnormal findings: Secondary | ICD-10-CM

## 2021-12-11 DIAGNOSIS — Z1211 Encounter for screening for malignant neoplasm of colon: Secondary | ICD-10-CM

## 2021-12-11 DIAGNOSIS — Z13228 Encounter for screening for other metabolic disorders: Secondary | ICD-10-CM

## 2021-12-11 DIAGNOSIS — Z13220 Encounter for screening for lipoid disorders: Secondary | ICD-10-CM

## 2021-12-11 DIAGNOSIS — I10 Essential (primary) hypertension: Secondary | ICD-10-CM

## 2021-12-11 DIAGNOSIS — Z0000 Encounter for general adult medical examination without abnormal findings: Secondary | ICD-10-CM

## 2021-12-11 DIAGNOSIS — Z23 Encounter for immunization: Secondary | ICD-10-CM

## 2021-12-11 DIAGNOSIS — K219 Gastro-esophageal reflux disease without esophagitis: Secondary | ICD-10-CM

## 2021-12-11 DIAGNOSIS — Z114 Encounter for screening for human immunodeficiency virus [HIV]: Secondary | ICD-10-CM

## 2021-12-11 DIAGNOSIS — Z1159 Encounter for screening for other viral diseases: Secondary | ICD-10-CM

## 2021-12-11 MED ORDER — ONDANSETRON 4 MG PO TBDP: 4.0000 mg | ORAL_TABLET | Freq: Three times a day (TID) | ORAL | 2 refills | Status: DC | PRN

## 2021-12-11 MED ORDER — AMLODIPINE BESYLATE 2.5 MG PO TABS: 2.5000 mg | ORAL_TABLET | Freq: Two times a day (BID) | ORAL | 2 refills | Status: DC

## 2021-12-11 NOTE — Patient Instructions (Signed)

## 2021-12-11 NOTE — Progress Notes (Signed)
Pt presents for annual physical exam request refill on Zofran

## 2021-12-12 ENCOUNTER — Other Ambulatory Visit: Payer: Self-pay | Admitting: Family

## 2021-12-12 DIAGNOSIS — E785 Hyperlipidemia, unspecified: Secondary | ICD-10-CM

## 2021-12-12 LAB — HIV ANTIBODY (ROUTINE TESTING W REFLEX): HIV Screen 4th Generation wRfx: NONREACTIVE

## 2021-12-12 LAB — LIPID PANEL
Chol/HDL Ratio: 4 ratio (ref 0.0–5.0)
Cholesterol, Total: 166 mg/dL (ref 100–199)
HDL: 42 mg/dL (ref >39)
LDL Chol Calc (NIH): 106 mg/dL — ABNORMAL HIGH (ref 0–99)
Triglycerides: 97 mg/dL (ref 0–149)
VLDL Cholesterol Cal: 18 mg/dL (ref 5–40)

## 2021-12-12 LAB — HEPATITIS C ANTIBODY: Hep C Virus Ab: <0.1 s/co ratio (ref 0.0–0.9)

## 2021-12-12 LAB — HEMOGLOBIN A1C
Est. average glucose Bld gHb Est-mCnc: 114 mg/dL
Hgb A1c MFr Bld: 5.6 % (ref 4.8–5.6)

## 2021-12-12 LAB — TSH: TSH: 0.685 u[IU]/mL (ref 0.450–4.500)

## 2021-12-12 NOTE — Progress Notes (Signed)
Please call patient with update.   Thyroid function normal.  No diabetes.   Hepatitis C negative.   HIV negative.   LDL cholesterol (sometimes called "bad cholesterol") higher than expected. High cholesterol may increase risk of heart attack and/or stroke. Consider eating more fruits, vegetables, and lean baked meats such as chicken or fish. Moderate intensity exercise at least 150 minutes as tolerated per week may help as well. No medication at the moment. Encouraged to recheck fasting cholesterol in 3 to 6 months.

## 2021-12-14 LAB — FECAL OCCULT BLOOD, IMMUNOCHEMICAL: Fecal Occult Bld: NEGATIVE

## 2021-12-17 NOTE — Progress Notes (Signed)
Please call patient with update.   Fecal occult blood negative.

## 2021-12-17 NOTE — Progress Notes (Signed)
Fecal occult blood negative. 

## 2022-02-07 ENCOUNTER — Other Ambulatory Visit: Payer: Self-pay | Admitting: Family

## 2022-02-07 DIAGNOSIS — I10 Essential (primary) hypertension: Secondary | ICD-10-CM

## 2022-02-08 NOTE — Telephone Encounter (Signed)
Amlodipine refilled as requested

## 2022-02-25 ENCOUNTER — Other Ambulatory Visit: Payer: Self-pay | Admitting: Family

## 2022-02-25 DIAGNOSIS — K219 Gastro-esophageal reflux disease without esophagitis: Secondary | ICD-10-CM

## 2022-04-14 ENCOUNTER — Other Ambulatory Visit: Payer: Self-pay

## 2022-04-14 ENCOUNTER — Emergency Department (HOSPITAL_BASED_OUTPATIENT_CLINIC_OR_DEPARTMENT_OTHER)
Admission: EM | Admit: 2022-04-14 | Discharge: 2022-04-14 | Disposition: A | Discharge status: Home / Self Care | Payer: Self-pay | Attending: Emergency Medicine | Admitting: Emergency Medicine

## 2022-04-14 ENCOUNTER — Emergency Department (HOSPITAL_BASED_OUTPATIENT_CLINIC_OR_DEPARTMENT_OTHER): Payer: Self-pay

## 2022-04-14 ENCOUNTER — Encounter (HOSPITAL_BASED_OUTPATIENT_CLINIC_OR_DEPARTMENT_OTHER): Payer: Self-pay | Admitting: Emergency Medicine

## 2022-04-14 DIAGNOSIS — R197 Diarrhea, unspecified: Secondary | ICD-10-CM | POA: Insufficient documentation

## 2022-04-14 DIAGNOSIS — I10 Essential (primary) hypertension: Secondary | ICD-10-CM | POA: Insufficient documentation

## 2022-04-14 DIAGNOSIS — R112 Nausea with vomiting, unspecified: Secondary | ICD-10-CM | POA: Insufficient documentation

## 2022-04-14 DIAGNOSIS — Z79899 Other long term (current) drug therapy: Secondary | ICD-10-CM | POA: Insufficient documentation

## 2022-04-14 DIAGNOSIS — R1033 Periumbilical pain: Secondary | ICD-10-CM | POA: Insufficient documentation

## 2022-04-14 LAB — URINALYSIS, MICROSCOPIC (REFLEX)

## 2022-04-14 LAB — COMPREHENSIVE METABOLIC PANEL
ALT: 22 U/L (ref 0–44)
AST: 30 U/L (ref 15–41)
Albumin: 4.2 g/dL (ref 3.5–5.0)
Alkaline Phosphatase: 70 U/L (ref 38–126)
Anion gap: 14 (ref 5–15)
BUN: 10 mg/dL (ref 6–20)
CO2: 23 mmol/L (ref 22–32)
Calcium: 9.2 mg/dL (ref 8.9–10.3)
Chloride: 103 mmol/L (ref 98–111)
Creatinine, Ser: 1.13 mg/dL (ref 0.61–1.24)
GFR, Estimated: >60 mL/min (ref >60)
Glucose, Bld: 140 mg/dL — ABNORMAL HIGH (ref 70–99)
Potassium: 3.2 mmol/L — ABNORMAL LOW (ref 3.5–5.1)
Sodium: 140 mmol/L (ref 135–145)
Total Bilirubin: 0.5 mg/dL (ref 0.3–1.2)
Total Protein: 8.2 g/dL — ABNORMAL HIGH (ref 6.5–8.1)

## 2022-04-14 LAB — CBC
HCT: 44.7 % (ref 39.0–52.0)
Hemoglobin: 15.3 g/dL (ref 13.0–17.0)
MCH: 31.1 pg (ref 26.0–34.0)
MCHC: 34.2 g/dL (ref 30.0–36.0)
MCV: 90.9 fL (ref 80.0–100.0)
Platelets: 278 10*3/uL (ref 150–400)
RBC: 4.92 MIL/uL (ref 4.22–5.81)
RDW: 12.6 % (ref 11.5–15.5)
WBC: 9.7 10*3/uL (ref 4.0–10.5)
nRBC: 0 % (ref 0.0–0.2)

## 2022-04-14 LAB — URINALYSIS, ROUTINE W REFLEX MICROSCOPIC
Bilirubin Urine: NEGATIVE
Glucose, UA: NEGATIVE mg/dL
Ketones, ur: NEGATIVE mg/dL
Leukocytes,Ua: NEGATIVE
Nitrite: NEGATIVE
Protein, ur: NEGATIVE mg/dL
Specific Gravity, Urine: 1.015 (ref 1.005–1.030)
pH: 8.5 — ABNORMAL HIGH (ref 5.0–8.0)

## 2022-04-14 LAB — LIPASE, BLOOD: Lipase: 33 U/L (ref 11–51)

## 2022-04-14 MED ORDER — DICYCLOMINE HCL 20 MG PO TABS
20.0000 mg | ORAL_TABLET | Freq: Two times a day (BID) | ORAL | 0 refills | Status: AC
Start: 2022-04-14 — End: ?

## 2022-04-14 MED ORDER — MORPHINE SULFATE (PF) 4 MG/ML IV SOLN
4.0000 mg | Freq: Once | INTRAVENOUS | Status: AC
  Administered 2022-04-14: 4 mg via INTRAVENOUS
  Filled 2022-04-14: qty 1

## 2022-04-14 MED ORDER — ONDANSETRON 4 MG PO TBDP: 4.0000 mg | ORAL_TABLET | Freq: Three times a day (TID) | ORAL | 0 refills | Status: AC | PRN

## 2022-04-14 MED ORDER — SODIUM CHLORIDE 0.9 % IV BOLUS
1000.0000 mL | Freq: Once | INTRAVENOUS | Status: AC
  Administered 2022-04-14: 1000 mL via INTRAVENOUS

## 2022-04-14 MED ORDER — POTASSIUM CHLORIDE CRYS ER 20 MEQ PO TBCR
40.0000 meq | EXTENDED_RELEASE_TABLET | Freq: Once | ORAL | Status: AC
  Administered 2022-04-14: 40 meq via ORAL
  Filled 2022-04-14: qty 2

## 2022-04-14 MED ORDER — IBUPROFEN 800 MG PO TABS
800.0000 mg | ORAL_TABLET | Freq: Once | ORAL | Status: DC
Start: 2022-04-14 — End: 2022-04-14

## 2022-04-14 MED ORDER — PANTOPRAZOLE SODIUM 40 MG IV SOLR
40.0000 mg | Freq: Once | INTRAVENOUS | Status: AC
  Administered 2022-04-14: 40 mg via INTRAVENOUS
  Filled 2022-04-14: qty 10

## 2022-04-14 MED ORDER — DICYCLOMINE HCL 10 MG PO CAPS
10.0000 mg | ORAL_CAPSULE | Freq: Once | ORAL | Status: AC
Start: 2022-04-14 — End: 2022-04-14
  Administered 2022-04-14: 10 mg via ORAL
  Filled 2022-04-14: qty 1

## 2022-04-14 MED ORDER — METOCLOPRAMIDE HCL 5 MG/ML IJ SOLN
10.0000 mg | Freq: Once | INTRAMUSCULAR | Status: AC
  Administered 2022-04-14: 10 mg via INTRAVENOUS
  Filled 2022-04-14: qty 2

## 2022-04-14 MED ORDER — IOHEXOL 300 MG/ML  SOLN
100.0000 mL | Freq: Once | INTRAMUSCULAR | Status: AC | PRN
  Administered 2022-04-14: 100 mL via INTRAVENOUS

## 2022-04-14 MED ORDER — DIPHENHYDRAMINE HCL 50 MG/ML IJ SOLN
25.0000 mg | Freq: Once | INTRAMUSCULAR | Status: AC
  Administered 2022-04-14: 25 mg via INTRAVENOUS
  Filled 2022-04-14: qty 1

## 2022-04-14 MED ORDER — ONDANSETRON 4 MG PO TBDP
4.0000 mg | ORAL_TABLET | Freq: Once | ORAL | Status: AC
  Administered 2022-04-14: 4 mg via ORAL
  Filled 2022-04-14: qty 1

## 2022-04-14 NOTE — ED Notes (Signed)
Pt mild distress, a/ox4, c/o umbilical ABD pain x 2 days, recurring. +n/v, states has been worked up for same many times with no cause.  ?

## 2022-04-14 NOTE — ED Triage Notes (Signed)
Pt arrives pov, to triage in wheelchair c/o abdominal pain with n/v/d ?

## 2022-04-14 NOTE — ED Provider Notes (Signed)
?[facility] EMERGENCY DEPARTMENT ?Provider Note ? ? ?CSN: [id number] ?Arrival date & time: 04/14/22  1232 ? ?  ? ?History ? ?Chief Complaint  ?Patient presents with  ? Abdominal Pain  ? ? ?Noah Avila is a 61 y.o. male. ? ?Noah Avila is a 62 y.o. male with past medical history significant for gastroparesis, kidney stones, and GERD who presents the ED with complaints of abdominal pain. He states that same began when he woke up this morning.  States that he has had several episodes of nausea and vomiting all morning with development of diarrhea approximately 1 hour prior to arrival.  Pain is located in the periumbilical region and does not radiate.  He states that he has had pain similar to this in the past and was thought that he could potentially have gastroparesis, however he states he has never had test for it.  He is not diabetic.  Denies any hematemesis, dysuria, hematuria, hematochezia, or melena. Denies any marijuana use. ? ? ?The history is provided by the patient. No language interpreter was used.  ?Abdominal Pain ?Associated symptoms: diarrhea, nausea and vomiting   ?Associated symptoms: no chest pain, no chills, no dysuria, no fever and no shortness of breath   ? ?  ? ?Home Medications ?Prior to Admission medications   ?Medication Sig Start Date End Date Taking? Authorizing Provider  ?amLODipine (NORVASC) 2.5 MG tablet Take 1 tablet (2.5 mg total) by mouth in the morning and at bedtime. 02/08/22 05/09/22  Rema Fendt, NP  ?dicyclomine (BENTYL) 20 MG tablet Take 1 tablet (20 mg total) by mouth 2 (two) times daily as needed for spasms. 06/03/21   Lorelee New, PA-C  ?famotidine (PEPCID) 20 MG tablet Take 1 tablet (20 mg total) by mouth 2 (two) times daily. 05/21/17   Kirichenko, Lemont Fillers, PA-C  ?metoCLOPramide (REGLAN) 10 MG tablet Take 1 tablet (10 mg total) by mouth every 6 (six) hours as needed for nausea (Pain). 06/03/21   Lorelee New, PA-C  ?morphine (MSIR) 15 MG tablet Take 1  tablet (15 mg total) by mouth every 4 (four) hours as needed for severe pain. ?Patient not taking: No sig reported 01/09/17   Melene Plan, DO  ?omeprazole (PRILOSEC) 40 MG capsule TAKE 1 CAPSULE(40 MG) BY MOUTH DAILY 02/25/22   Rema Fendt, NP  ?ondansetron (ZOFRAN ODT) 4 MG disintegrating tablet Take 1 tablet (4 mg total) by mouth every 8 (eight) hours as needed for nausea or vomiting. 12/11/21   Rema Fendt, NP  ?promethazine (PHENERGAN) 12.5 MG tablet Take 1 tablet (12.5 mg total) by mouth every 6 (six) hours as needed for nausea or vomiting. ?Patient not taking: No sig reported 05/21/17   Jaynie Crumble, PA-C  ?sucralfate (CARAFATE) 1 g tablet Take 1 tablet (1 g total) by mouth 4 (four) times daily -  with meals and at bedtime for 7 days. 08/13/18 08/20/18  LongArlyss Repress, MD  ?   ? ?Allergies    ?Patient has no known allergies.   ? ?Review of Systems   ?Review of Systems  ?Constitutional:  Negative for chills and fever.  ?Respiratory:  Negative for shortness of breath.   ?Cardiovascular:  Negative for chest pain.  ?Gastrointestinal:  Positive for abdominal pain, diarrhea, nausea and vomiting.  ?Genitourinary:  Negative for dysuria.  ?All other systems reviewed and are negative. ? ?Physical Exam ?Updated Vital Signs ?BP (!) 173/91   Pulse 77   Temp 98.3 ?F (36.8 ?C) (  Oral)   Resp 16   Ht 5\' 8"  (1.727 m)   Wt 77.1 kg   SpO2 91%   BMI 25.85 kg/m?  ?Physical Exam ?Vitals and nursing note reviewed.  ?Constitutional:   ?   General: He is not in acute distress. ?   Appearance: Normal appearance. He is well-developed and normal weight. He is not ill-appearing, toxic-appearing or diaphoretic.  ?HENT:  ?   Head: Normocephalic and atraumatic.  ?Cardiovascular:  ?   Rate and Rhythm: Normal rate and regular rhythm.  ?   Heart sounds: Normal heart sounds.  ?Pulmonary:  ?   Effort: Pulmonary effort is normal. No respiratory distress.  ?   Breath sounds: Normal breath sounds.  ?Abdominal:  ?   General: Abdomen is  flat.  ?   Palpations: Abdomen is soft.  ?   Tenderness: There is abdominal tenderness in the periumbilical area. There is no right CVA tenderness, left CVA tenderness, guarding or rebound. Negative signs include Murphy's sign and McBurney's sign.  ?Musculoskeletal:     ?   General: Normal range of motion.  ?   Cervical back: Normal range of motion.  ?Skin: ?   General: Skin is warm and dry.  ?Neurological:  ?   General: No focal deficit present.  ?   Mental Status: He is alert.  ?Psychiatric:     ?   Mood and Affect: Mood normal.     ?   Behavior: Behavior normal.  ? ? ?ED Results / Procedures / Treatments   ?Labs ?(all labs ordered are listed, but only abnormal results are displayed) ?Labs Reviewed  ?COMPREHENSIVE METABOLIC PANEL - Abnormal; Notable for the following components:  ?    Result Value  ? Potassium 3.2 (*)   ? Glucose, Bld 140 (*)   ? Total Protein 8.2 (*)   ? All other components within normal limits  ?URINALYSIS, ROUTINE W REFLEX MICROSCOPIC - Abnormal; Notable for the following components:  ? pH 8.5 (*)   ? Hgb urine dipstick TRACE (*)   ? All other components within normal limits  ?URINALYSIS, MICROSCOPIC (REFLEX) - Abnormal; Notable for the following components:  ? Bacteria, UA RARE (*)   ? All other components within normal limits  ?LIPASE, BLOOD  ?CBC  ? ? ?EKG ?None ? ?Radiology ?CT ABDOMEN PELVIS W CONTRAST ? ?Result Date: 04/14/2022 ?CLINICAL DATA:  Abdominal pain with nausea vomiting and diarrhea. EXAM: CT ABDOMEN AND PELVIS WITH CONTRAST TECHNIQUE: Multidetector CT imaging of the abdomen and pelvis was performed using the standard protocol following bolus administration of intravenous contrast. RADIATION DOSE REDUCTION: This exam was performed according to the departmental dose-optimization program which includes automated exposure control, adjustment of the mA and/or kV according to patient size and/or use of iterative reconstruction technique. CONTRAST:  100mL OMNIPAQUE IOHEXOL 300 MG/ML   SOLN COMPARISON:  June 03, 2021 FINDINGS: Lower chest: Atelectasis versus scarring in the lingula. Hepatobiliary: No focal liver abnormality is seen. No gallstones, gallbladder wall thickening, or biliary dilatation. Pancreas: Unremarkable. No pancreatic ductal dilatation or surrounding inflammatory changes. Spleen: Normal in size without focal abnormality. Adrenals/Urinary Tract: Adrenal glands are unremarkable. Kidneys are normal, without renal calculi, focal lesion, or hydronephrosis. Bladder is unremarkable. Stomach/Bowel: Stomach is within normal limits. Appendix appears normal. No evidence of bowel wall thickening, distention, or inflammatory changes. Vascular/Lymphatic: Aortic atherosclerosis. No enlarged abdominal or pelvic lymph nodes. Reproductive: The prostate is irregularly enlarged with prominent protrusion into the base of the urinary bladder. Other:  No abdominal wall hernia or abnormality. No abdominopelvic ascites. Musculoskeletal: L5-S1 spondylosis. IMPRESSION: 1. No acute abnormalities within the abdomen or pelvis. 2. Irregularly enlarged prostate gland with prominent protrusion into the base of the urinary bladder. Please correlate to patient's PSA values. Aortic Atherosclerosis (ICD10-I70.0). Electronically Signed   By: Ted Mcalpine M.D.   On: 04/14/2022 15:36   ? ?Procedures ?Procedures  ? ? ?Medications Ordered in ED ?Medications  ?iohexol (OMNIPAQUE) 300 MG/ML solution 100 mL (has no administration in time range)  ?ondansetron (ZOFRAN-ODT) disintegrating tablet 4 mg (4 mg Oral Given 04/14/22 1308)  ?sodium chloride 0.9 % bolus 1,000 mL (1,000 mLs Intravenous New Bag/Given 04/14/22 1420)  ?morphine (PF) 4 MG/ML injection 4 mg (4 mg Intravenous Given 04/14/22 1427)  ?pantoprazole (PROTONIX) injection 40 mg (40 mg Intravenous Given 04/14/22 1421)  ?metoCLOPramide (REGLAN) injection 10 mg (10 mg Intravenous Given 04/14/22 1425)  ?diphenhydrAMINE (BENADRYL) injection 25 mg (25 mg Intravenous  Given 04/14/22 1421)  ? ? ?ED Course/ Medical Decision Making/ A&P ?  ?                        ?Medical Decision Making ?Amount and/or Complexity of Data Reviewed ?Labs: ordered. ?Radiology: ordered. ? ?Risk ?Pres

## 2022-04-14 NOTE — ED Notes (Signed)
Pt NAD, a/ox4. Pt verbalizes understanding of all DC and f/u instructions. All questions answered. Pt walks with steady gait to lobby at DC.  ? ?

## 2022-04-14 NOTE — Discharge Instructions (Addendum)
As we discussed, your work-up in the ER today was reassuring for acute abnormalities.  I recommend calling your PCP tomorrow for further evaluation and management of your symptoms.  I have given you a prescription for Zofran and Bentyl to help with nausea and pain.  Please take these as needed as prescribed. ? ?Return if development of any new or worsening symptoms. ?

## 2022-05-21 ENCOUNTER — Other Ambulatory Visit: Payer: Self-pay | Admitting: Family

## 2022-05-21 DIAGNOSIS — K219 Gastro-esophageal reflux disease without esophagitis: Secondary | ICD-10-CM

## 2022-05-22 NOTE — Telephone Encounter (Signed)
Requested Prescriptions  Pending Prescriptions Disp Refills  . omeprazole (PRILOSEC) 40 MG capsule [Pharmacy Med Name: OMEPRAZOLE 40MG  CAPSULES] 90 capsule 0    Sig: TAKE 1 CAPSULE(40 MG) BY MOUTH DAILY     Gastroenterology: Proton Pump Inhibitors Passed - 05/21/2022  5:19 PM      Passed - Valid encounter within last 12 months    Recent Outpatient Visits          5 months ago Annual physical exam   Primary Care at [facility], Amy J, NP   5 months ago Encounter to establish care   Primary Care at [facility], NP

## 2022-07-29 ENCOUNTER — Other Ambulatory Visit: Payer: Self-pay | Admitting: Family

## 2022-07-29 DIAGNOSIS — I10 Essential (primary) hypertension: Secondary | ICD-10-CM

## 2022-07-29 NOTE — Telephone Encounter (Signed)
Schedule appointment?

## 2022-08-01 ENCOUNTER — Other Ambulatory Visit: Payer: Self-pay | Admitting: Family

## 2022-08-01 DIAGNOSIS — I10 Essential (primary) hypertension: Secondary | ICD-10-CM

## 2022-08-01 DIAGNOSIS — K219 Gastro-esophageal reflux disease without esophagitis: Secondary | ICD-10-CM

## 2022-08-01 MED ORDER — OMEPRAZOLE 40 MG PO CPDR: DELAYED_RELEASE_CAPSULE | ORAL | 0 refills | Status: DC

## 2022-08-01 NOTE — Telephone Encounter (Signed)
Medication Refill - Medication:  amLODipine (NORVASC) 2.5 MG tablet omeprazole (PRILOSEC) 40 MG capsule  Has the patient contacted their pharmacy? No.   Preferred Pharmacy (with phone number or street name):  [id number] [place], [place] - [address] AT [facility] Phone:  [phone]  Fax:  [phone]     Has the patient been seen for an appointment in the last year OR does the patient have an upcoming appointment? Yes.

## 2022-08-01 NOTE — Telephone Encounter (Signed)
Requested Prescriptions  Pending Prescriptions Disp Refills  . amLODipine (NORVASC) 2.5 MG tablet 180 tablet 0    Sig: Take 1 tablet (2.5 mg total) by mouth in the morning and at bedtime.     Cardiovascular: Calcium Channel Blockers 2 Failed - 08/01/2022  3:28 PM      Failed - Last BP in normal range    BP Readings from Last 1 Encounters:  04/14/22 (!) 144/80         Failed - Valid encounter within last 6 months    Recent Outpatient Visits          7 months ago Annual physical exam   Primary Care at [facility], Amy J, NP   8 months ago Encounter to establish care   Primary Care at [facility], Amy J, NP             Passed - Last Heart Rate in normal range    Pulse Readings from Last 1 Encounters:  04/14/22 82         . omeprazole (PRILOSEC) 40 MG capsule 90 capsule 0    Sig: TAKE 1 CAPSULE(40 MG) BY MOUTH DAILY     Gastroenterology: Proton Pump Inhibitors Passed - 08/01/2022  3:28 PM      Passed - Valid encounter within last 12 months    Recent Outpatient Visits          7 months ago Annual physical exam   Primary Care at [facility], Amy J, NP   8 months ago Encounter to establish care   Primary Care at [facility], Salomon Fick, NP

## 2022-08-01 NOTE — Telephone Encounter (Signed)
Requested medication (s) are due for refill today: expired medication  Requested medication (s) are on the active medication list: yes   Last refill:  02/08/22- 05/09/22 #180 0 refills  Future visit scheduled: no   Notes to clinic:  expired medication. Attempted to contact patient to schedule appt for med refills. No answer, VM full unable to leave message. Do you want to renew Rx?     Requested Prescriptions  Pending Prescriptions Disp Refills   amLODipine (NORVASC) 2.5 MG tablet 180 tablet 0    Sig: Take 1 tablet (2.5 mg total) by mouth in the morning and at bedtime.     Cardiovascular: Calcium Channel Blockers 2 Failed - 08/01/2022  3:28 PM      Failed - Last BP in normal range    BP Readings from Last 1 Encounters:  04/14/22 (!) 144/80         Failed - Valid encounter within last 6 months    Recent Outpatient Visits           7 months ago Annual physical exam   Primary Care at [facility], Amy J, NP   8 months ago Encounter to establish care   Primary Care at [facility], Amy J, NP              Passed - Last Heart Rate in normal range    Pulse Readings from Last 1 Encounters:  04/14/22 82         Signed Prescriptions Disp Refills   omeprazole (PRILOSEC) 40 MG capsule 90 capsule 0    Sig: TAKE 1 CAPSULE(40 MG) BY MOUTH DAILY     Gastroenterology: Proton Pump Inhibitors Passed - 08/01/2022  3:28 PM      Passed - Valid encounter within last 12 months    Recent Outpatient Visits           7 months ago Annual physical exam   Primary Care at [facility], Amy J, NP   8 months ago Encounter to establish care   Primary Care at [facility], Salomon Fick, NP

## 2022-08-01 NOTE — Telephone Encounter (Signed)
Called patient to schedule appt for medication refills. No answer, mailbox full and unable to leave message

## 2022-09-04 ENCOUNTER — Telehealth: Payer: Self-pay | Admitting: Family

## 2022-09-04 NOTE — Telephone Encounter (Signed)
AMLODOPINE MED REFILLS NEEDED AND APPT WAS SCHEDULED CLOSEST AVAILABLE IN CLINIC

## 2022-09-11 ENCOUNTER — Ambulatory Visit (INDEPENDENT_AMBULATORY_CARE_PROVIDER_SITE_OTHER): Payer: Self-pay | Admitting: Physician Assistant

## 2022-09-11 ENCOUNTER — Encounter: Payer: Self-pay | Admitting: Physician Assistant

## 2022-09-11 VITALS — BP 158/91 | HR 71 | Temp 98.1°F | Resp 16 | Wt 163.1 lb

## 2022-09-11 DIAGNOSIS — I10 Essential (primary) hypertension: Secondary | ICD-10-CM

## 2022-09-11 DIAGNOSIS — E876 Hypokalemia: Secondary | ICD-10-CM

## 2022-09-11 DIAGNOSIS — R739 Hyperglycemia, unspecified: Secondary | ICD-10-CM

## 2022-09-11 MED ORDER — AMLODIPINE BESYLATE 10 MG PO TABS: 10.0000 mg | ORAL_TABLET | Freq: Every day | ORAL | 1 refills | Status: DC

## 2022-09-11 NOTE — Progress Notes (Signed)
Patient is here for medication refill. Patient has been out of medication for 1 month.Patient has no other concerns

## 2022-09-11 NOTE — Patient Instructions (Signed)
Check blood pressures daily-goal blood pressure is <130/85.    Hypertension, Adult Hypertension is another name for high blood pressure. High blood pressure forces your heart to work harder to pump blood. This can cause problems over time. There are two numbers in a blood pressure reading. There is a top number (systolic) over a bottom number (diastolic). It is best to have a blood pressure that is below 120/80. What are the causes? The cause of this condition is not known. Some other conditions can lead to high blood pressure. What increases the risk? Some lifestyle factors can make you more likely to develop high blood pressure: Smoking. Not getting enough exercise or physical activity. Being overweight. Having too much fat, sugar, calories, or salt (sodium) in your diet. Drinking too much alcohol. Other risk factors include: Having any of these conditions: Heart disease. Diabetes. High cholesterol. Kidney disease. Obstructive sleep apnea. Having a family history of high blood pressure and high cholesterol. Age. The risk increases with age. Stress. What are the signs or symptoms? High blood pressure may not cause symptoms. Very high blood pressure (hypertensive crisis) may cause: Headache. Fast or uneven heartbeats (palpitations). Shortness of breath. Nosebleed. Vomiting or feeling like you may vomit (nauseous). Changes in how you see. Very bad chest pain. Feeling dizzy. Seizures. How is this treated? This condition is treated by making healthy lifestyle changes, such as: Eating healthy foods. Exercising more. Drinking less alcohol. Your doctor may prescribe medicine if lifestyle changes do not help enough and if: Your top number is above 130. Your bottom number is above 80. Your personal target blood pressure may vary. Follow these instructions at home: Eating and drinking  If told, follow the DASH eating plan. To follow this plan: Fill one half of your plate at  each meal with fruits and vegetables. Fill one fourth of your plate at each meal with whole grains. Whole grains include whole-wheat pasta, brown rice, and whole-grain bread. Eat or drink low-fat dairy products, such as skim milk or low-fat yogurt. Fill one fourth of your plate at each meal with low-fat (lean) proteins. Low-fat proteins include fish, chicken without skin, eggs, beans, and tofu. Avoid fatty meat, cured and processed meat, or chicken with skin. Avoid pre-made or processed food. Limit the amount of salt in your diet to less than 1,500 mg each day. Do not drink alcohol if: Your doctor tells you not to drink. You are pregnant, may be pregnant, or are planning to become pregnant. If you drink alcohol: Limit how much you have to: 0-1 drink a day for women. 0-2 drinks a day for men. Know how much alcohol is in your drink. In the [place] one drink equals one 12 oz bottle of beer (355 mL), one 5 oz glass of wine (148 mL), or one 1 oz glass of hard liquor (44 mL). Lifestyle  Work with your doctor to stay at a healthy weight or to lose weight. Ask your doctor what the best weight is for you. Get at least 30 minutes of exercise that causes your heart to beat faster (aerobic exercise) most days of the week. This may include walking, swimming, or biking. Get at least 30 minutes of exercise that strengthens your muscles (resistance exercise) at least 3 days a week. This may include lifting weights or doing Pilates. Do not smoke or use any products that contain nicotine or tobacco. If you need help quitting, ask your doctor. Check your blood pressure at home as told by your  doctor. Keep all follow-up visits. Medicines Take over-the-counter and prescription medicines only as told by your doctor. Follow directions carefully. Do not skip doses of blood pressure medicine. The medicine does not work as well if you skip doses. Skipping doses also puts you at risk for problems. Ask your doctor  about side effects or reactions to medicines that you should watch for. Contact a doctor if: You think you are having a reaction to the medicine you are taking. You have headaches that keep coming back. You feel dizzy. You have swelling in your ankles. You have trouble with your vision. Get help right away if: You get a very bad headache. You start to feel mixed up (confused). You feel weak or numb. You feel faint. You have very bad pain in your: Chest. Belly (abdomen). You vomit more than once. You have trouble breathing. These symptoms may be an emergency. Get help right away. Call 911. Do not wait to see if the symptoms will go away. Do not drive yourself to the hospital. Summary Hypertension is another name for high blood pressure. High blood pressure forces your heart to work harder to pump blood. For most people, a normal blood pressure is less than 120/80. Making healthy choices can help lower blood pressure. If your blood pressure does not get lower with healthy choices, you may need to take medicine. This information is not intended to replace advice given to you by your health care provider. Make sure you discuss any questions you have with your health care provider. Document Revised: 09/27/2021 Document Reviewed: 09/27/2021 Elsevier Patient Education  [place].

## 2022-09-11 NOTE — Progress Notes (Signed)
Patient ID: Noah Avila, male   DOB: 1961/12/21, 61 y.o.   MRN: [id number]   Noah Avila, is a 61 y.o. male  WFU:[id number]  KGU:[id number]  DOB - 08-Dec-1961  Chief Complaint  Patient presents with   Medication Refill       Subjective:   Noah Avila is a 61 y.o. male here today for amlodipine med RF.  Denies CP/SOB/Dizziness/HA.  When he checks his blood pressure at home even on meds it is 140s-150s/90s.  He has been taking amlodipine 2.5mg  bid but out for a few weeks.  He tolerates the medications well.    No problems updated.  ALLERGIES: No Known Allergies  PAST MEDICAL HISTORY: Past Medical History:  Diagnosis Date   Family history of anesthesia complication    "mom has PONV"   GERD (gastroesophageal reflux disease)    Headache(784.0)    "bad ones; not regular" (07/26/2014)   Nephrolithiasis     MEDICATIONS AT HOME: Prior to Admission medications   Medication Sig Start Date End Date Taking? Authorizing Provider  amLODipine (NORVASC) 10 MG tablet Take 1 tablet (10 mg total) by mouth daily. 09/11/22  Yes Stalin Gruenberg M, PA-C  dicyclomine (BENTYL) 20 MG tablet Take 1 tablet (20 mg total) by mouth 2 (two) times daily. 04/14/22  Yes Smoot, Leary Roca, PA-C  famotidine (PEPCID) 20 MG tablet Take 1 tablet (20 mg total) by mouth 2 (two) times daily. 05/21/17  Yes Kirichenko, Tatyana, PA-C  omeprazole (PRILOSEC) 40 MG capsule TAKE 1 CAPSULE(40 MG) BY MOUTH DAILY 08/01/22  Yes Minette Brine, Amy J, NP  ondansetron (ZOFRAN-ODT) 4 MG disintegrating tablet Take 1 tablet (4 mg total) by mouth every 8 (eight) hours as needed for nausea or vomiting. 04/14/22  Yes Smoot, Sarah A, PA-C  metoCLOPramide (REGLAN) 10 MG tablet Take 1 tablet (10 mg total) by mouth every 6 (six) hours as needed for nausea (Pain). Patient not taking: Reported on 09/11/2022 06/03/21   Corena Herter, PA-C  morphine (MSIR) 15 MG tablet Take 1 tablet (15 mg total) by mouth every 4 (four) hours as needed for severe  pain. Patient not taking: Reported on 08/13/2018 01/09/17   Deno Etienne, DO  promethazine (PHENERGAN) 12.5 MG tablet Take 1 tablet (12.5 mg total) by mouth every 6 (six) hours as needed for nausea or vomiting. Patient not taking: Reported on 08/13/2018 05/21/17   Jeannett Senior, PA-C  sucralfate (CARAFATE) 1 g tablet Take 1 tablet (1 g total) by mouth 4 (four) times daily -  with meals and at bedtime for 7 days. 08/13/18 08/20/18  Long, Wonda Olds, MD    ROS: Neg HEENT Neg resp Neg cardiac Neg GI Neg GU Neg MS Neg psych Neg neuro  Objective:   Vitals:   09/11/22 0916 09/11/22 0922  BP: (!) 146/90 (!) 158/91  Pulse: 71   Resp: 16   Temp: 98.1 F (36.7 C)   TempSrc: Oral   SpO2: 95%   Weight: 163 lb 1.3 oz (74 kg)    Exam General appearance : Awake, alert, not in any distress. Speech Clear. Not toxic looking HEENT: Atraumatic and Normocephalic Neck: Supple, no JVD. No cervical lymphadenopathy.  Chest: Good air entry bilaterally, CTAB.  No rales/rhonchi/wheezing CVS: S1 S2 regular, no murmurs.  Extremities: B/L Lower Ext shows no edema, both legs are warm to touch Neurology: Awake alert, and oriented X 3, CN II-XII intact, Non focal Skin: No Rash  Data Review Lab Results  Component Value Date  HGBA1C 5.6 12/11/2021    Assessment & Plan   1. Hypokalemia - Basic metabolic panel  2. Essential hypertension Increase dose-BP OOO running 150-160/90s on 2.5mg  bid.  Increase dose-I did tell him to split the 10g and take 1/2 twice daily for the first week and if he is tolerating it well, he can increase dose to 1 tablet daily - amLODipine (NORVASC) 10 MG tablet; Take 1 tablet (10 mg total) by mouth daily.  Dispense: 90 tablet; Refill: 1 Check blood pressures daily-goal blood pressure is <130/85.    3. Elevated blood sugar I have had a lengthy discussion and provided education about insulin resistance and the intake of too much sugar/refined carbohydrates.  I have advised the  patient to work at a goal of eliminating sugary drinks, candy, desserts, sweets, refined sugars, processed foods, and white carbohydrates.  The patient expresses understanding.  - Basic metabolic panel    Return in about 4 months (around 01/11/2023) for blood pressure with PCP. Or sooner if BP not at goal.  Patient verbalizes understanding.    The patient was given clear instructions to go to ER or return to medical center if symptoms don't improve, worsen or new problems develop. The patient verbalized understanding. The patient was told to call to get lab results if they haven't heard anything in the next week.      Georgian Co, PA-C [facility] [place], [place] [phone]   09/11/2022, 9:34 AM

## 2022-09-13 ENCOUNTER — Telehealth: Payer: Self-pay | Admitting: Family

## 2022-09-13 LAB — SPECIMEN STATUS REPORT

## 2022-09-13 LAB — BASIC METABOLIC PANEL
BUN/Creatinine Ratio: 10 (ref 9–20)
BUN: 11 mg/dL (ref 6–24)
CO2: 25 mmol/L (ref 20–29)
Calcium: 8.9 mg/dL (ref 8.7–10.2)
Chloride: 103 mmol/L (ref 96–106)
Creatinine, Ser: 1.07 mg/dL (ref 0.76–1.27)
Glucose: 64 mg/dL — ABNORMAL LOW (ref 70–99)
Potassium: 4.2 mmol/L (ref 3.5–5.2)
Sodium: 143 mmol/L (ref 134–144)
eGFR: 80 mL/min/{1.73_m2} (ref >59)

## 2022-09-13 NOTE — Telephone Encounter (Signed)
Noah Avila calling from [facility] is calling to receive advice. Received a purple top - Is this extra or is anything additional needed?   Please advise CB- [phone]

## 2022-09-13 NOTE — Telephone Encounter (Signed)
[facility] called to discard extra tube

## 2022-11-19 ENCOUNTER — Other Ambulatory Visit: Payer: Self-pay | Admitting: Family

## 2022-11-19 DIAGNOSIS — K219 Gastro-esophageal reflux disease without esophagitis: Secondary | ICD-10-CM

## 2022-11-19 NOTE — Telephone Encounter (Signed)
Order complete. 

## 2022-12-30 NOTE — Progress Notes (Unsigned)
Patient ID: Noah Avila, male    DOB: 05/07/1961  MRN: [id number]  CC: Chronic Care Management   Subjective: Noah Avila is a 62 y.o. male who presents for chronic care management.  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 90 tablet 1   dicyclomine (BENTYL) 20 MG tablet Take 1 tablet (20 mg total) by mouth 2 (two) times daily. 20 tablet 0   famotidine (PEPCID) 20 MG tablet Take 1 tablet (20 mg total) by mouth 2 (two) times daily. 30 tablet 0   metoCLOPramide (REGLAN) 10 MG tablet Take 1 tablet (10 mg total) by mouth every 6 (six) hours as needed for nausea (Pain). (Patient not taking: Reported on 09/11/2022) 30 tablet 0   morphine (MSIR) 15 MG tablet Take 1 tablet (15 mg total) by mouth every 4 (four) hours as needed for severe pain. (Patient not taking: Reported on 08/13/2018) 10 tablet 0   omeprazole (PRILOSEC) 40 MG capsule TAKE 1 CAPSULE(40 MG) BY MOUTH DAILY 30 capsule 2   ondansetron (ZOFRAN-ODT) 4 MG disintegrating tablet Take 1 tablet (4 mg total) by mouth every 8 (eight) hours as needed for nausea or vomiting. 20 tablet 0   promethazine (PHENERGAN) 12.5 MG tablet Take 1 tablet (12.5 mg total) by mouth every 6 (six) hours as needed for nausea or vomiting. (Patient not taking: Reported on 08/13/2018) 15 tablet 0   sucralfate (CARAFATE) 1 g tablet Take 1 tablet (1 g total) by mouth 4 (four) times daily -  with meals and at bedtime for 7 days. 28 tablet 0   No current facility-administered medications on file prior to visit.    No Known Allergies  Social History   Socioeconomic History    Marital status: Married    Spouse name: Not on file   Number of children: Not on file   Years of education: Not on file   Highest education level: Not on file  Occupational History   Not on file  Tobacco Use   Smoking status: Never   Smokeless tobacco: Never  Substance and Sexual Activity   Alcohol use: Not Currently   Drug use: No   Sexual activity: Yes  Other Topics Concern   Not on file  Social History Narrative   Not on file   Social Determinants of Health   Financial Resource Strain: Not on file  Food Insecurity: Not on file  Transportation Needs: Not on file  Physical Activity: Not on file  Stress: Not on file  Social Connections: Not on file  Intimate Partner Violence: Not on file    Family History  Problem Relation Age of Onset   Coronary artery disease Mother    Lung cancer Mother    Renal Disease Father    Diabetes Father    Heart disease Father    Heart disease Brother     Past Surgical History:  Procedure Laterality Date   NO PAST SURGERIES      ROS: Review of Systems Negative except as stated above  PHYSICAL EXAM: There were no vitals taken for this visit.  Physical Exam  {male adult master:310786} {male adult master:310785}     Latest Ref Rng & Units 09/11/2022    9:38 AM 04/14/2022    1:30 PM 09/07/2021   10:19 AM  CMP  Glucose 70 - 99 mg/dL 64  462  86   BUN 6 - 24 mg/dL 11  10  12    Creatinine 0.76 - 1.27 mg/dL  7.03  5.00   Sodium 134 - 144 mmol/L 143  140  142   Potassium 3.5 - 5.2 mmol/L 4.2  3.2  3.9   Chloride 96 - 106 mmol/L 103  103  105   CO2 20 - 29 mmol/L 25  23  24    Calcium 8.7 - 10.2 mg/dL 8.9  9.2  9.2   Total Protein 6.5 - 8.1 g/dL  8.2  6.9   Total Bilirubin 0.3 - 1.2 mg/dL  0.5  0.3   Alkaline Phos 38 - 126 U/L  70  76   AST 15 - 41 U/L  30  20   ALT 0 - 44 U/L  22  19    Lipid Panel     Component Value Date/Time   CHOL 166 12/11/2021 0851   TRIG 97 12/11/2021 0851   HDL 42 12/11/2021 0851    CHOLHDL 4.0 12/11/2021 0851   LDLCALC 106 (H) 12/11/2021 0851    CBC    Component Value Date/Time   WBC 9.7 04/14/2022 1330   RBC 4.92 04/14/2022 1330   HGB 15.3 04/14/2022 1330   HGB 14.0 09/07/2021 1019   HCT 44.7 04/14/2022 1330   HCT 41.6 09/07/2021 1019   PLT 278 04/14/2022 1330   PLT 224 09/07/2021 1019   MCV 90.9 04/14/2022 1330   MCV 92 09/07/2021 1019   MCH 31.1 04/14/2022 1330   MCHC 34.2 04/14/2022 1330   RDW 12.6 04/14/2022 1330   RDW 13.1 09/07/2021 1019   LYMPHSABS 2.1 06/03/2021 1409   MONOABS 0.4 06/03/2021 1409   EOSABS 0.1 06/03/2021 1409   BASOSABS 0.0 06/03/2021 1409    ASSESSMENT AND PLAN:  There are no diagnoses linked to this encounter.   Patient was given the opportunity to ask questions.  Patient verbalized understanding of the plan and was able to repeat key elements of the plan. Patient was given clear instructions to go to Emergency Department or return to medical center if symptoms don't improve, worsen, or new problems develop.The patient verbalized understanding.   No orders of the defined types were placed in this encounter.    Requested Prescriptions    No prescriptions requested or ordered in this encounter    No follow-ups on file.  08/03/2021, NP

## 2023-01-01 ENCOUNTER — Ambulatory Visit (INDEPENDENT_AMBULATORY_CARE_PROVIDER_SITE_OTHER): Payer: Self-pay | Admitting: Family

## 2023-01-01 ENCOUNTER — Encounter: Payer: Self-pay | Admitting: Family

## 2023-01-01 VITALS — BP 122/85 | HR 72 | Temp 98.3°F | Resp 16 | Ht 68.7 in | Wt 168.0 lb

## 2023-01-01 DIAGNOSIS — Z13220 Encounter for screening for lipoid disorders: Secondary | ICD-10-CM

## 2023-01-01 DIAGNOSIS — M62830 Muscle spasm of back: Secondary | ICD-10-CM

## 2023-01-01 DIAGNOSIS — Z23 Encounter for immunization: Secondary | ICD-10-CM

## 2023-01-01 DIAGNOSIS — I10 Essential (primary) hypertension: Secondary | ICD-10-CM

## 2023-01-01 DIAGNOSIS — Z131 Encounter for screening for diabetes mellitus: Secondary | ICD-10-CM

## 2023-01-01 MED ORDER — CYCLOBENZAPRINE HCL 5 MG PO TABS: 5.0000 mg | ORAL_TABLET | Freq: Three times a day (TID) | ORAL | 1 refills | Status: DC | PRN

## 2023-01-01 MED ORDER — AMLODIPINE BESYLATE 10 MG PO TABS
10.0000 mg | ORAL_TABLET | Freq: Every day | ORAL | 2 refills | Status: DC
Start: 2023-01-01 — End: 2023-04-01

## 2023-01-01 NOTE — Progress Notes (Signed)
.  Pt presents for chronic care management   -pt dealing w/muscle spasms in back due to moving this past Saturday request muscle relaxant

## 2023-01-02 ENCOUNTER — Other Ambulatory Visit: Payer: Self-pay | Admitting: Family

## 2023-01-02 ENCOUNTER — Encounter: Payer: Self-pay | Admitting: Family

## 2023-01-02 DIAGNOSIS — R7303 Prediabetes: Secondary | ICD-10-CM

## 2023-01-02 DIAGNOSIS — E785 Hyperlipidemia, unspecified: Secondary | ICD-10-CM | POA: Insufficient documentation

## 2023-01-02 LAB — LIPID PANEL
Chol/HDL Ratio: 3.7 ratio (ref 0.0–5.0)
Cholesterol, Total: 178 mg/dL (ref 100–199)
HDL: 48 mg/dL (ref >39)
LDL Chol Calc (NIH): 113 mg/dL — ABNORMAL HIGH (ref 0–99)
Triglycerides: 95 mg/dL (ref 0–149)
VLDL Cholesterol Cal: 17 mg/dL (ref 5–40)

## 2023-01-02 LAB — HEMOGLOBIN A1C
Est. average glucose Bld gHb Est-mCnc: 123 mg/dL
Hgb A1c MFr Bld: 5.9 % — ABNORMAL HIGH (ref 4.8–5.6)

## 2023-01-02 MED ORDER — ATORVASTATIN CALCIUM 20 MG PO TABS: 20.0000 mg | ORAL_TABLET | Freq: Every day | ORAL | 2 refills | Status: DC

## 2023-01-02 NOTE — Addendum Note (Signed)
Addended by: Elmon Else on: 01/02/2023 12:02 PM   Modules accepted: Orders

## 2023-02-19 ENCOUNTER — Other Ambulatory Visit: Payer: Self-pay | Admitting: Family

## 2023-02-19 DIAGNOSIS — K219 Gastro-esophageal reflux disease without esophagitis: Secondary | ICD-10-CM

## 2023-02-20 NOTE — Telephone Encounter (Signed)
Requested Prescriptions  Pending Prescriptions Disp Refills   omeprazole (PRILOSEC) 40 MG capsule [Pharmacy Med Name: OMEPRAZOLE '40MG'$  CAPSULES] 90 capsule 0    Sig: TAKE 1 CAPSULE(40 MG) BY MOUTH DAILY     Gastroenterology: Proton Pump Inhibitors Passed - 02/19/2023  1:49 PM      Passed - Valid encounter within last 12 months    Recent Outpatient Visits           1 month ago Primary hypertension   [facility], Amy J, NP   5 months ago Hypokalemia   [facility], Dionne Bucy, [place]   1 year ago Annual physical exam   [facility], [place], NP   1 year ago Encounter to establish care   [facility], Flonnie Hailstone, NP       Future Appointments             In 1 month Camillia Herter, NP [place] [facility]

## 2023-03-25 NOTE — Progress Notes (Signed)
Patient ID: Noah Avila, male    DOB: 01/09/1961  MRN: [id number]  CC: Chronic Care Management   Subjective: Noah Avila is a 62 y.o. male who presents for chronic care management.   His concerns today include:  - Doing well on blood pressure and cholesterol medications, no issues/concerns. He denies red flag symptoms such as but not limited to chest pain, shortness of breath, worst headache of life, nausea/vomiting.  - Doing well on acid reflux medication, no issues/concerns.  - Needs refills on muscle relaxant for muscle spasms of back.    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 20 tablet 0   ondansetron (ZOFRAN-ODT) 4 MG disintegrating tablet Take 1 tablet (4 mg total) by mouth every 8 (eight) hours as needed for nausea or vomiting. 20 tablet 0   No current facility-administered medications on file prior to visit.    No Known Allergies  Social History   Socioeconomic History   Marital status: Married    Spouse name: Not on file   Number of children: Not on file   Years of education: Not on file   Highest education level: Not on file  Occupational History   Not on file  Tobacco Use   Smoking status: Never    Passive exposure: Never   Smokeless tobacco: Never  Substance and Sexual Activity   Alcohol use: Not Currently   Drug use: No   Sexual activity: Yes  Other Topics Concern   Not on file  Social History Narrative   Not on file   Social Determinants of Health   Financial Resource Strain: Not on file  Food Insecurity: Not on file  Transportation Needs: Not on file  Physical Activity: Not  on file  Stress: Not on file  Social Connections: Not on file  Intimate Partner Violence: Not on file    Family History  Problem Relation Age of Onset   Coronary artery disease Mother    Lung cancer Mother    Renal Disease Father    Diabetes Father    Heart disease Father    Heart disease Brother     Past Surgical History:  Procedure Laterality Date   NO PAST SURGERIES      ROS: Review of Systems Negative except as stated above  PHYSICAL EXAM: BP 127/73 (BP Location: Right Arm, Patient Position: Sitting, Cuff Size: Normal)   Pulse 76   Temp 98.1 F (36.7 C)   Resp 14   Ht 5\' 8"  (1.727 m)   Wt 173 lb 3.2 oz (78.6 kg)   SpO2 97%   BMI 26.33 kg/m   Physical Exam HENT:     Head: Normocephalic and atraumatic.  Eyes:     Extraocular Movements: Extraocular movements intact.     Conjunctiva/sclera: Conjunctivae normal.     Pupils: Pupils are equal, round, and reactive to light.  Cardiovascular:     Rate and Rhythm: Normal rate and regular rhythm.     Pulses: Normal pulses.     Heart sounds: Normal heart sounds.  Pulmonary:     Effort: Pulmonary effort is normal.  Breath sounds: Normal breath sounds.  Musculoskeletal:     Cervical back: Normal range of motion and neck supple.  Neurological:     General: No focal deficit present.     Mental Status: He is alert and oriented to person, place, and time.  Psychiatric:        Mood and Affect: Mood normal.        Behavior: Behavior normal.     ASSESSMENT AND PLAN: 1. Primary hypertension - Continue Amlodipine as prescribed.  - Counseled on blood pressure goal of less than 130/80, low-sodium, DASH diet, medication compliance, and 150 minutes of moderate intensity exercise per week as tolerated. Counseled on medication adherence and adverse effects. - Follow-up with primary provider in 6 months or sooner if needed.  - amLODipine (NORVASC) 10 MG tablet; Take 1 tablet (10 mg total) by mouth daily.  Dispense: 90  tablet; Refill: 0  2. Hyperlipidemia, unspecified hyperlipidemia type - Continue Atorvastatin as prescribed.  - Follow-up with primary provider in 6 months or sooner if needed.  - atorvastatin (LIPITOR) 20 MG tablet; Take 1 tablet (20 mg total) by mouth daily.  Dispense: 90 tablet; Refill: 0  3. Gastroesophageal reflux disease, unspecified whether esophagitis present - Continue Omeprazole as prescribed.  - Follow-up with primary provider in 6 months or sooner if needed.  - omeprazole (PRILOSEC) 40 MG capsule; Take 1 capsule (40 mg total) by mouth daily.  Dispense: 90 capsule; Refill: 0  4. Spasm of back muscles - Continue Cyclobenzaprine as prescribed.  - Follow-up with primary provider as scheduled.  - cyclobenzaprine (FLEXERIL) 5 MG tablet; Take 1 tablet (5 mg total) by mouth 3 (three) times daily as needed for muscle spasms.  Dispense: 30 tablet; Refill: 1   Patient was given the opportunity to ask questions.  Patient verbalized understanding of the plan and was able to repeat key elements of the plan. Patient was given clear instructions to go to Emergency Department or return to medical center if symptoms don't improve, worsen, or new problems develop.The patient verbalized understanding.    Requested Prescriptions   Signed Prescriptions Disp Refills   amLODipine (NORVASC) 10 MG tablet 90 tablet 0    Sig: Take 1 tablet (10 mg total) by mouth daily.   atorvastatin (LIPITOR) 20 MG tablet 90 tablet 0    Sig: Take 1 tablet (20 mg total) by mouth daily.   omeprazole (PRILOSEC) 40 MG capsule 90 capsule 0    Sig: Take 1 capsule (40 mg total) by mouth daily.   cyclobenzaprine (FLEXERIL) 5 MG tablet 30 tablet 1    Sig: Take 1 tablet (5 mg total) by mouth 3 (three) times daily as needed for muscle spasms.    Return in about 6 months (around 10/01/2023) for Follow-Up or next available chronic care mgmt .  Rema FendtAmy J Alexei Doswell, NP

## 2023-04-01 ENCOUNTER — Encounter: Payer: Self-pay | Admitting: Family

## 2023-04-01 ENCOUNTER — Ambulatory Visit (INDEPENDENT_AMBULATORY_CARE_PROVIDER_SITE_OTHER): Payer: Self-pay | Admitting: Family

## 2023-04-01 VITALS — BP 127/73 | HR 76 | Temp 98.1°F | Resp 14 | Ht 68.0 in | Wt 173.2 lb

## 2023-04-01 DIAGNOSIS — K219 Gastro-esophageal reflux disease without esophagitis: Secondary | ICD-10-CM

## 2023-04-01 DIAGNOSIS — M62830 Muscle spasm of back: Secondary | ICD-10-CM

## 2023-04-01 DIAGNOSIS — I10 Essential (primary) hypertension: Secondary | ICD-10-CM

## 2023-04-01 DIAGNOSIS — E785 Hyperlipidemia, unspecified: Secondary | ICD-10-CM

## 2023-04-01 MED ORDER — AMLODIPINE BESYLATE 10 MG PO TABS: 10.0000 mg | ORAL_TABLET | Freq: Every day | ORAL | 0 refills | Status: DC

## 2023-04-01 MED ORDER — OMEPRAZOLE 40 MG PO CPDR: 40.0000 mg | DELAYED_RELEASE_CAPSULE | Freq: Every day | ORAL | 0 refills | Status: DC

## 2023-04-01 MED ORDER — ATORVASTATIN CALCIUM 20 MG PO TABS: 20.0000 mg | ORAL_TABLET | Freq: Every day | ORAL | 0 refills | Status: DC

## 2023-04-01 MED ORDER — CYCLOBENZAPRINE HCL 5 MG PO TABS: 5.0000 mg | ORAL_TABLET | Freq: Three times a day (TID) | ORAL | 1 refills | Status: DC | PRN

## 2023-04-01 NOTE — Progress Notes (Signed)
Pt is here for chronic care mgt   Needs refills on his muscle spasm medication

## 2023-04-02 LAB — LIPID PANEL
Chol/HDL Ratio: 2.2 ratio (ref 0.0–5.0)
Cholesterol, Total: 116 mg/dL (ref 100–199)
HDL: 52 mg/dL (ref >39)
LDL Chol Calc (NIH): 52 mg/dL (ref 0–99)
Triglycerides: 49 mg/dL (ref 0–149)
VLDL Cholesterol Cal: 12 mg/dL (ref 5–40)

## 2023-04-14 ENCOUNTER — Other Ambulatory Visit: Payer: Self-pay | Admitting: Family

## 2023-04-14 DIAGNOSIS — E785 Hyperlipidemia, unspecified: Secondary | ICD-10-CM

## 2023-05-08 ENCOUNTER — Other Ambulatory Visit: Payer: Self-pay | Admitting: *Deleted

## 2023-05-08 DIAGNOSIS — M62830 Muscle spasm of back: Secondary | ICD-10-CM

## 2023-05-09 MED ORDER — CYCLOBENZAPRINE HCL 5 MG PO TABS: 5.0000 mg | ORAL_TABLET | Freq: Three times a day (TID) | ORAL | 1 refills | Status: DC | PRN

## 2023-05-09 NOTE — Telephone Encounter (Signed)
Complete

## 2023-06-09 IMAGING — CT CT ABD-PELV W/ CM
2 of 5 series · 16 of 46 positions shown, 18 images · IV contrast (Omnipaque)
Comparison: January 09, 2017

CLINICAL DATA: Abdominal pain

EXAM:
CT ABDOMEN AND PELVIS WITH CONTRAST
TECHNIQUE: Multidetector CT imaging of the abdomen and pelvis was performed
using the standard protocol following bolus administration of
intravenous contrast.
CONTRAST:  100mL OMNIPAQUE IOHEXOL 300 MG/ML  SOLN

[Series 2: axial st · axial · 0.78mm/px · z∈[-438,-63]mm · 13 of 85 slices shown, 15 images]
[im 5/85  soft-tissue]
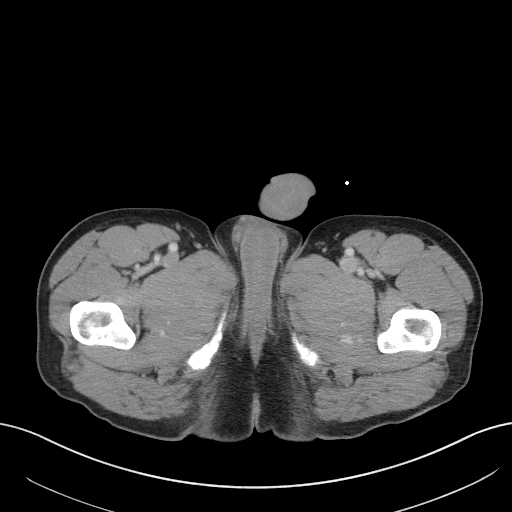
[im 5/85  bone]
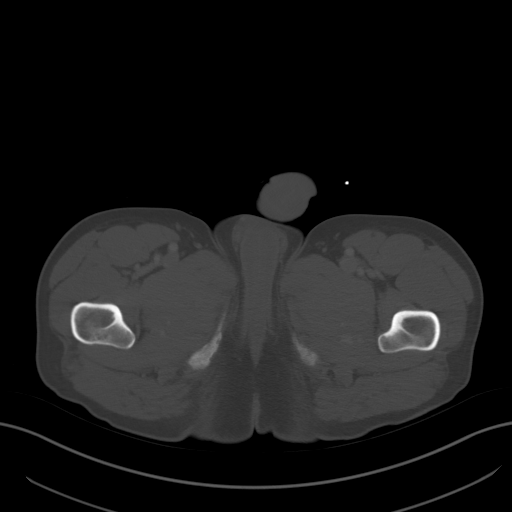
[im 10/85  soft-tissue]
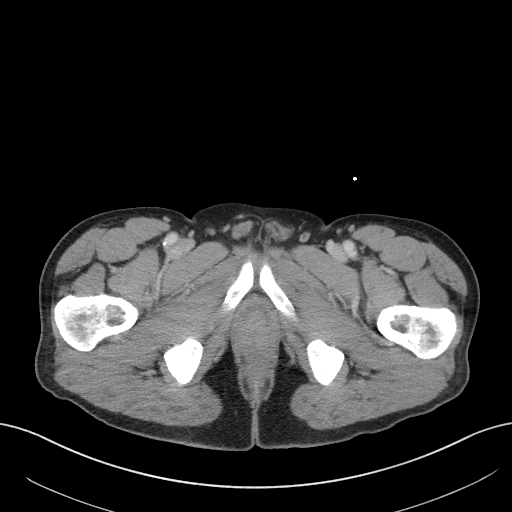
[im 19/85  soft-tissue]
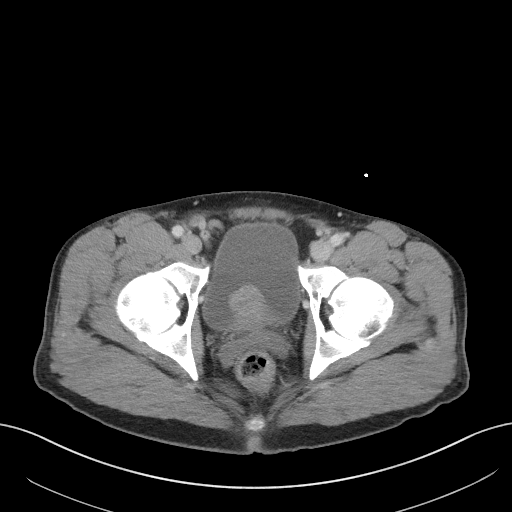
[im 24/85  soft-tissue]
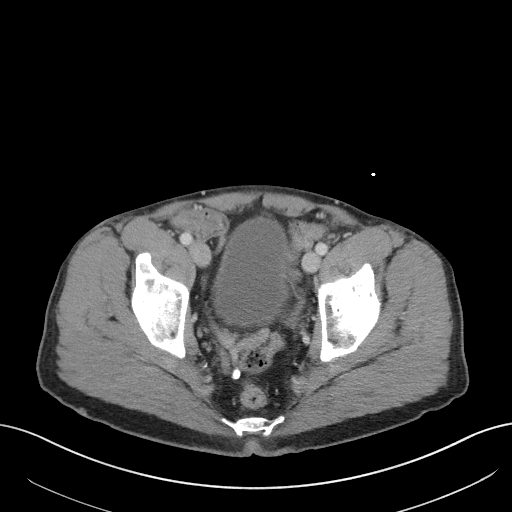
[im 29/85  soft-tissue]
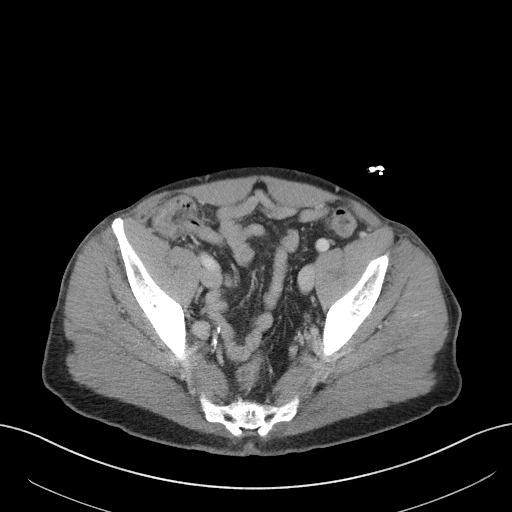
[im 38/85  soft-tissue]
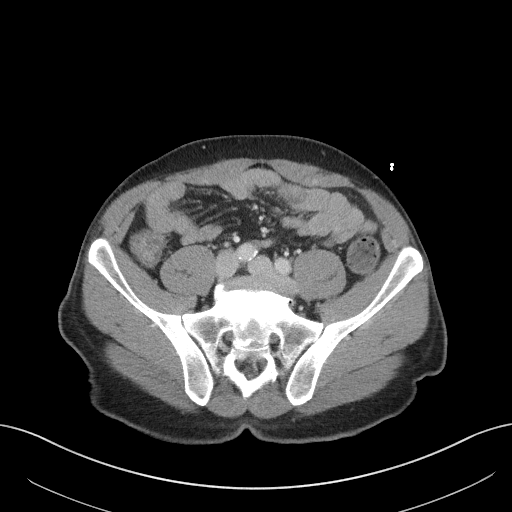
[im 43/85  soft-tissue]
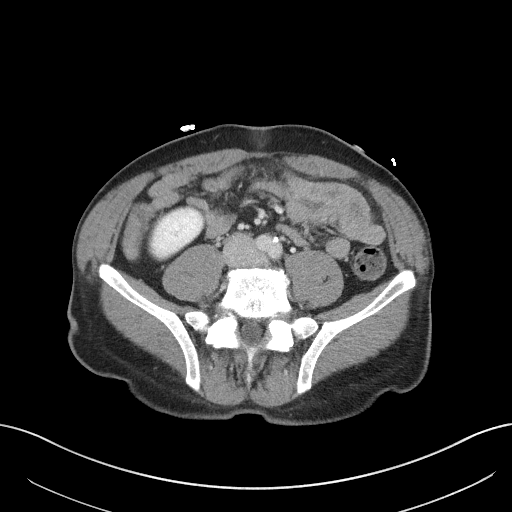
[im 47/85  soft-tissue]
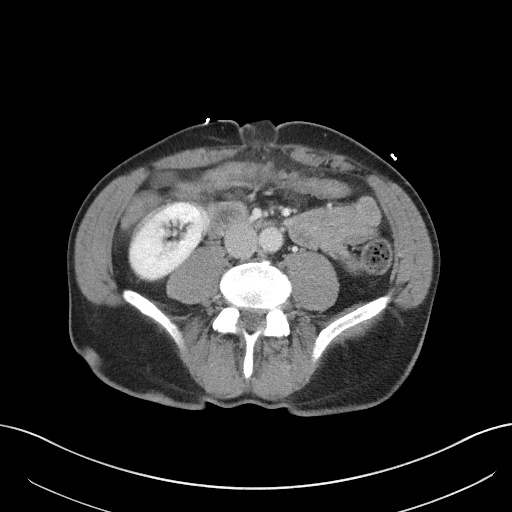
[im 57/85  soft-tissue]
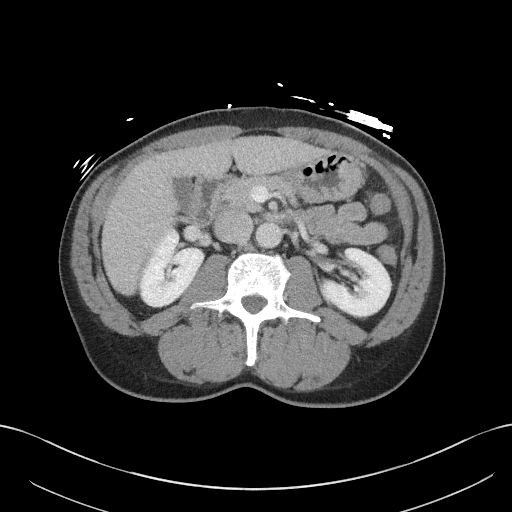
[im 57/85  bone]
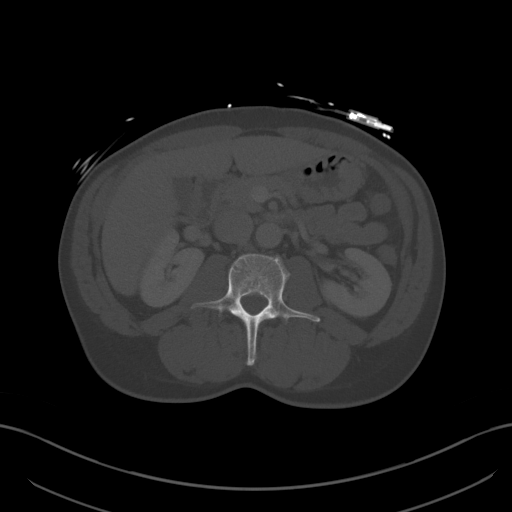
[im 61/85  soft-tissue]
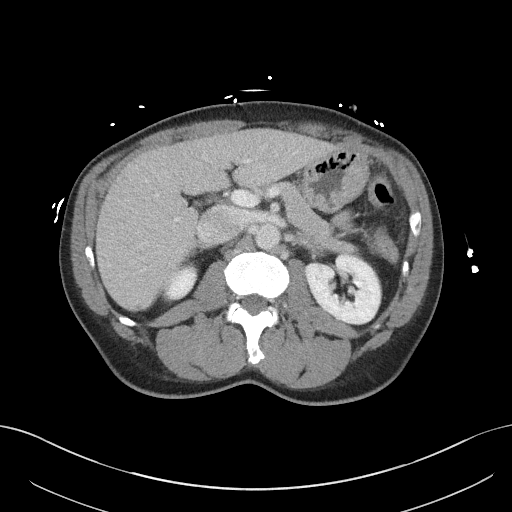
[im 66/85  soft-tissue]
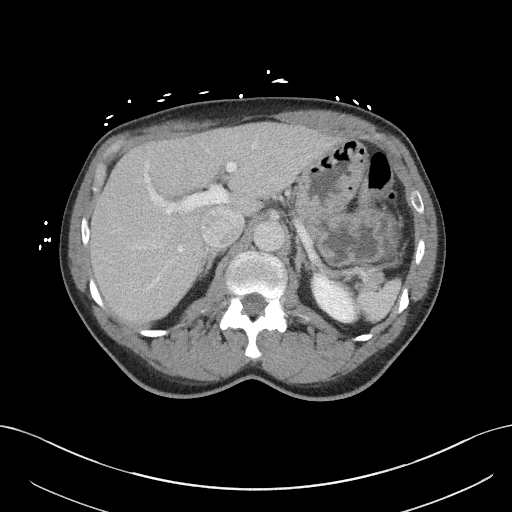
[im 75/85  soft-tissue]
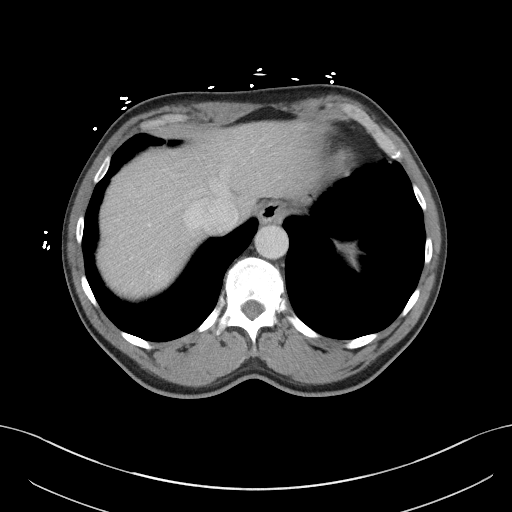
[im 80/85  soft-tissue]
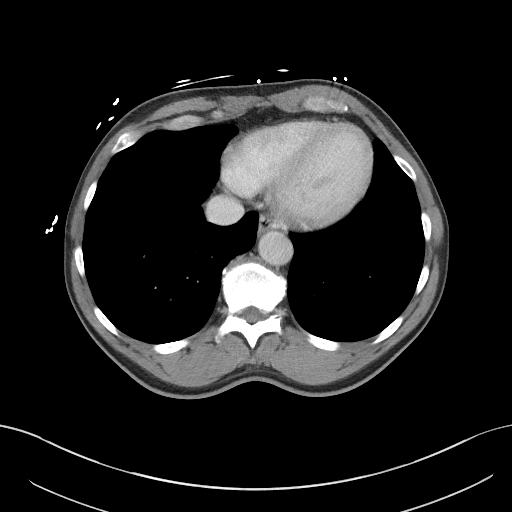

[Series 5: coronal st · coronal · 0.78mm/px · 3 of 98 slices shown]
[im 33/98  soft-tissue]
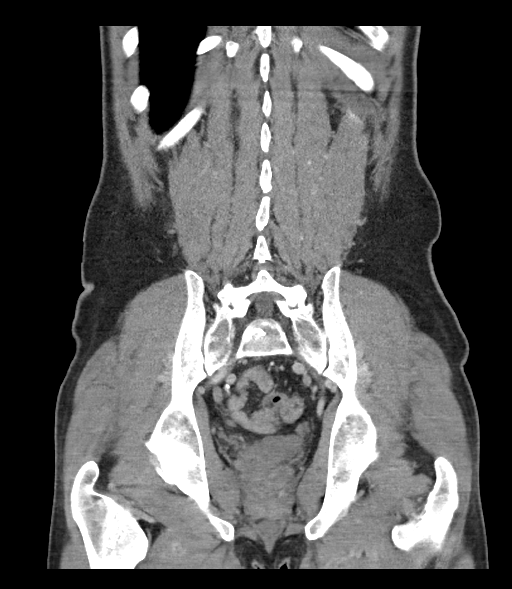
[im 44/98  soft-tissue]
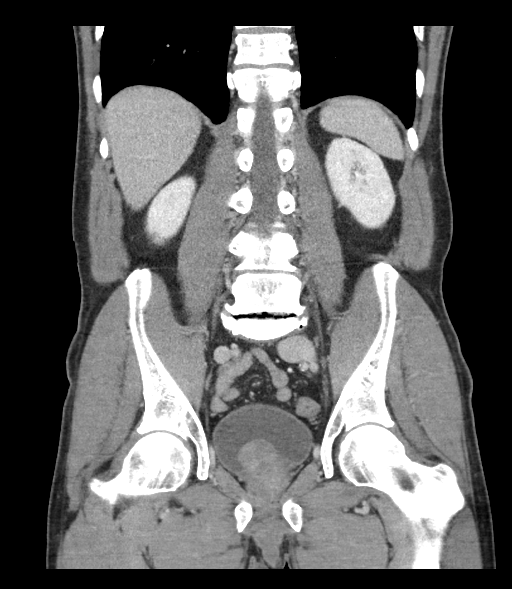
[im 54/98  soft-tissue]
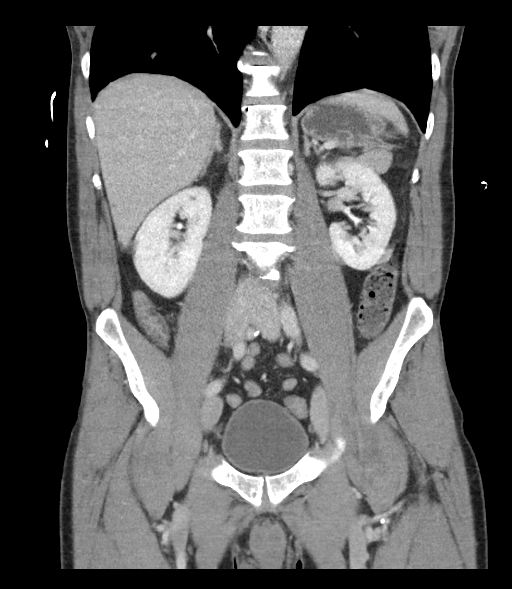

[16 of 46 positions shown; findings below may reference images not displayed]

FINDINGS: Lower chest: Unchanged 3 mm LEFT lower lobe pulmonary nodule since
6604, consistent with a benign etiology (series 4, image 8).

Hepatobiliary: Unremarkable appearance of the liver. Gallbladder is
unremarkable. No intrahepatic or extrahepatic biliary ductal
dilation. Portal vein is patent.

Pancreas: Unremarkable. No pancreatic ductal dilatation or
surrounding inflammatory changes.

Spleen: Normal in size without focal abnormality.

Adrenals/Urinary Tract: Adrenal glands are unremarkable. Kidneys
enhance symmetrically. No hydronephrosis. Subcentimeter hypodense
lesions are too small to accurately characterize. Bladder is
unremarkable.

Stomach/Bowel: No evidence of bowel obstruction. Appendix is normal.
Colon is decompressed.

Vascular/Lymphatic: Atherosclerotic calcifications. No suspicious
lymphadenopathy.

Reproductive: Hypertrophy of the median lobe of the prostate.
Prostatomegaly.

Other: Fat containing umbilical hernia.

Musculoskeletal: No acute or significant osseous findings.
IMPRESSION: No CT etiology for acute abdominal pain identified. Normal appendix.

Aortic Atherosclerosis (C31IC-7JO.O).

## 2023-07-22 ENCOUNTER — Other Ambulatory Visit: Payer: Self-pay

## 2023-07-22 DIAGNOSIS — E785 Hyperlipidemia, unspecified: Secondary | ICD-10-CM

## 2023-07-22 MED ORDER — ATORVASTATIN CALCIUM 20 MG PO TABS: 20.0000 mg | ORAL_TABLET | Freq: Every day | ORAL | 0 refills | Status: DC

## 2023-07-28 ENCOUNTER — Other Ambulatory Visit: Payer: Self-pay | Admitting: Family

## 2023-07-28 DIAGNOSIS — I10 Essential (primary) hypertension: Secondary | ICD-10-CM

## 2023-07-28 NOTE — Telephone Encounter (Signed)
Medication Refill - Medication: amLODipine (NORVASC) 10 MG tablet [  Has the patient contacted their pharmacy? yes  Preferred Pharmacy (with phone number or street name): [facility][id number] Gin[place] Otto, [place] - [address] AT [facility]  [address] [place] [address]  Phone:  [phone]  Fax:  [phone]      Has the patient been seen for an appointment in the last year OR does the patient have an upcoming appointment? Yes.    Agent: Please be advised that RX refills may take up to 3 business days. We ask that you follow-up with your pharmacy.

## 2023-07-29 MED ORDER — AMLODIPINE BESYLATE 10 MG PO TABS: 10.0000 mg | ORAL_TABLET | Freq: Every day | ORAL | 0 refills | Status: DC

## 2023-07-29 NOTE — Telephone Encounter (Signed)
Requested Prescriptions  Pending Prescriptions Disp Refills   amLODipine (NORVASC) 10 MG tablet 90 tablet 0    Sig: Take 1 tablet (10 mg total) by mouth daily.     Cardiovascular: Calcium Channel Blockers 2 Passed - 07/28/2023  2:46 PM      Passed - Last BP in normal range    BP Readings from Last 1 Encounters:  04/01/23 127/73         Passed - Last Heart Rate in normal range    Pulse Readings from Last 1 Encounters:  04/01/23 76         Passed - Valid encounter within last 6 months    Recent Outpatient Visits           3 months ago Primary hypertension   [place] [facility], [place], [place]   6 months ago Primary hypertension   [place] [facility], [place], [place]   10 months ago Hypokalemia   [facility], Marzella Schlein, [place]   1 year ago Annual physical exam   [facility] at [facility], [place], [place]   1 year ago Encounter to establish care   [facility] at [facility], Salomon Fick, NP       Future Appointments             In 2 months Rema Fendt, NP [facility]

## 2023-08-22 ENCOUNTER — Other Ambulatory Visit: Payer: Self-pay | Admitting: Family

## 2023-08-22 ENCOUNTER — Other Ambulatory Visit: Payer: Self-pay

## 2023-08-22 DIAGNOSIS — K219 Gastro-esophageal reflux disease without esophagitis: Secondary | ICD-10-CM

## 2023-08-22 MED ORDER — OMEPRAZOLE 40 MG PO CPDR: 40.0000 mg | DELAYED_RELEASE_CAPSULE | Freq: Every day | ORAL | 0 refills | Status: DC

## 2023-08-22 NOTE — Telephone Encounter (Signed)
Requested Prescriptions  Refused Prescriptions Disp Refills   omeprazole (PRILOSEC) 40 MG capsule 90 capsule 0    Sig: Take 1 capsule (40 mg total) by mouth daily.     Gastroenterology: Proton Pump Inhibitors Passed - 08/22/2023 11:15 AM      Passed - Valid encounter within last 12 months    Recent Outpatient Visits           4 months ago Primary hypertension   [facility], [place], [place]   7 months ago Primary hypertension   [place] [facility], Salomon Fick, [place]   11 months ago Hypokalemia   [facility], Marzella Schlein, PA-C   1 year ago Annual physical exam   [facility], [place], [place]   1 year ago Encounter to establish care   [facility] at [facility], Salomon Fick, NP       Future Appointments             In 1 month Rema Fendt, NP [facility] at [facility]

## 2023-08-22 NOTE — Telephone Encounter (Signed)
Medication Refill - Medication: omeprazole (PRILOSEC) 40 MG capsule   Has the patient contacted their pharmacy? Yes.   Pt told to contact provider  Preferred Pharmacy (with phone number or street name):  [facility] #[id number] Ginette Otto, [place] - [address] AT [facility] Phone: [phone]  Fax: [phone]     Has the patient been seen for an appointment in the last year OR does the patient have an upcoming appointment? Yes.    Agent: Please be advised that RX refills may take up to 3 business days. We ask that you follow-up with your pharmacy.

## 2023-10-01 ENCOUNTER — Ambulatory Visit (INDEPENDENT_AMBULATORY_CARE_PROVIDER_SITE_OTHER): Payer: Self-pay | Admitting: Family

## 2023-10-01 VITALS — BP 125/87 | HR 79 | Temp 98.1°F | Resp 16 | Wt 172.0 lb

## 2023-10-01 DIAGNOSIS — M62830 Muscle spasm of back: Secondary | ICD-10-CM

## 2023-10-01 DIAGNOSIS — K219 Gastro-esophageal reflux disease without esophagitis: Secondary | ICD-10-CM

## 2023-10-01 DIAGNOSIS — E785 Hyperlipidemia, unspecified: Secondary | ICD-10-CM

## 2023-10-01 DIAGNOSIS — I10 Essential (primary) hypertension: Secondary | ICD-10-CM

## 2023-10-01 DIAGNOSIS — Z131 Encounter for screening for diabetes mellitus: Secondary | ICD-10-CM

## 2023-10-01 MED ORDER — CYCLOBENZAPRINE HCL 5 MG PO TABS: 5.0000 mg | ORAL_TABLET | Freq: Three times a day (TID) | ORAL | 1 refills | Status: DC | PRN

## 2023-10-01 MED ORDER — OMEPRAZOLE 40 MG PO CPDR: 40.0000 mg | DELAYED_RELEASE_CAPSULE | Freq: Every day | ORAL | 0 refills | Status: DC

## 2023-10-01 MED ORDER — ATORVASTATIN CALCIUM 20 MG PO TABS: 20.0000 mg | ORAL_TABLET | Freq: Every day | ORAL | 0 refills | Status: DC

## 2023-10-01 MED ORDER — AMLODIPINE BESYLATE 10 MG PO TABS: 10.0000 mg | ORAL_TABLET | Freq: Every day | ORAL | 0 refills | Status: DC

## 2023-10-01 NOTE — Progress Notes (Signed)
Patient ID: Noah Avila, male    DOB: Jun 10, 1961  MRN: [id number]  CC: Chronic Conditions Follow-Up  Subjective: Noah Avila is a 62 y.o. male who presents for chronic conditions follow-up.   His concerns today include:  - Doing well on Amlodipine, no issues/concerns. He does not complain of red flag symptoms such as but not limited to chest pain, shortness of breath, worst headache of life, nausea/vomiting.  - Doing well on Atorvastatin, no issues/concerns.  - Doing well on Omeprazole, no issues/concerns.  - Doing well on Cyclobenzaprine, no issues/concerns.   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 20 tablet 0   ondansetron (ZOFRAN-ODT) 4 MG disintegrating tablet Take 1 tablet (4 mg total) by mouth every 8 (eight) hours as needed for nausea or vomiting. 20 tablet 0   No current facility-administered medications on file prior to visit.    No Known Allergies  Social History   Socioeconomic History   Marital status: Married    Spouse name: Not on file   Number of children: Not on file   Years of education: Not on file   Highest education level: Not on file  Occupational History   Not on file  Tobacco Use   Smoking status: Never    Passive exposure: Never   Smokeless tobacco: Never  Substance and Sexual Activity   Alcohol use: Not Currently   Drug use: No   Sexual activity: Yes  Other Topics Concern   Not on file  Social History Narrative   Not on file   Social Determinants of Health   Financial Resource Strain: Low Risk  (10/01/2023)   Overall Financial Resource Strain (CARDIA)    Difficulty  of Paying Living Expenses: Not very hard  Food Insecurity: No Food Insecurity (10/01/2023)   Hunger Vital Sign    Worried About Running Out of Food in the Last Year: Never true    Ran Out of Food in the Last Year: Never true  Transportation Needs: No Transportation Needs (10/01/2023)   PRAPARE - Administrator, Civil Service (Medical): No    Lack of Transportation (Non-Medical): No  Physical Activity: Inactive (10/01/2023)   Exercise Vital Sign    Days of Exercise per Week: 0 days    Minutes of Exercise per Session: 0 min  Stress: No Stress Concern Present (10/01/2023)   [employer] of Occupational Health - Occupational Stress Questionnaire    Feeling of Stress : Not at all  Social Connections: Moderately Isolated (10/01/2023)   Social Connection and Isolation Panel [NHANES]    Frequency of Communication with Friends and Family: Three times a week    Frequency of Social Gatherings with Friends and Family: Three times a week    Attends Religious Services: More than 4 times per year    Active Member of Clubs or Organizations: No    Attends Banker Meetings: Never    Marital Status: Never married  Intimate Partner Violence: Not At Risk (10/01/2023)   Humiliation, Afraid, Rape, and Kick questionnaire    Fear of Current or Ex-Partner: No  Emotionally Abused: No    Physically Abused: No    Sexually Abused: No    Family History  Problem Relation Age of Onset   Coronary artery disease Mother    Lung cancer Mother    Renal Disease Father    Diabetes Father    Heart disease Father    Heart disease Brother     Past Surgical History:  Procedure Laterality Date   NO PAST SURGERIES      ROS: Review of Systems Negative except as stated above  PHYSICAL EXAM: BP 125/87   Pulse 79   Temp 98.1 F (36.7 C) (Oral)   Resp 16   Wt 172 lb (78 kg)   SpO2 98%   BMI 26.15 kg/m   Physical Exam HENT:     Head: Normocephalic and atraumatic.     Nose: Nose  normal.     Mouth/Throat:     Mouth: Mucous membranes are moist.     Pharynx: Oropharynx is clear.  Eyes:     Extraocular Movements: Extraocular movements intact.     Conjunctiva/sclera: Conjunctivae normal.     Pupils: Pupils are equal, round, and reactive to light.  Cardiovascular:     Rate and Rhythm: Normal rate and regular rhythm.     Pulses: Normal pulses.     Heart sounds: Normal heart sounds.  Pulmonary:     Effort: Pulmonary effort is normal.     Breath sounds: Normal breath sounds.  Musculoskeletal:        General: Normal range of motion.     Cervical back: Normal range of motion and neck supple.  Neurological:     General: No focal deficit present.     Mental Status: He is alert and oriented to person, place, and time.  Psychiatric:        Mood and Affect: Mood normal.        Behavior: Behavior normal.    ASSESSMENT AND PLAN: 1. Primary hypertension - Continue Amlodipine as prescribed.  - Routine screening.  - Counseled on blood pressure goal of less than 130/80, low-sodium, DASH diet, medication compliance, and 150 minutes of moderate intensity exercise per week as tolerated. Counseled on medication adherence and adverse effects. - Follow-up with primary provider in 3 months or sooner if needed.  - Basic Metabolic Panel - amLODipine (NORVASC) 10 MG tablet; Take 1 tablet (10 mg total) by mouth daily.  Dispense: 90 tablet; Refill: 0  2. Hyperlipidemia, unspecified hyperlipidemia type - Continue Atorvastatin as prescribed. Counseled on medication adherence/adverse effects.  - Routine screening.  - Follow-up with primary provider as scheduled.  - Lipid panel - atorvastatin (LIPITOR) 20 MG tablet; Take 1 tablet (20 mg total) by mouth daily.  Dispense: 90 tablet; Refill: 0  3. Diabetes mellitus screening - Routine screening.  - Hemoglobin A1c  4. Gastroesophageal reflux disease, unspecified whether esophagitis present - Continue Omeprazole as prescribed. Counseled  on medication adherence/adverse effects.  - Follow-up with primary provider in 3 months or sooner if needed.  - omeprazole (PRILOSEC) 40 MG capsule; Take 1 capsule (40 mg total) by mouth daily.  Dispense: 90 capsule; Refill: 0  5. Spasm of back muscles - Continue Cyclobenzaprine as prescribed. Counseled on medication adherence/adverse effects  - Follow-up with primary provider in 3 months or sooner if needed.  - cyclobenzaprine (FLEXERIL) 5 MG tablet; Take 1 tablet (5 mg total) by mouth 3 (three) times daily as needed for muscle spasms.  Dispense: 90 tablet; Refill: 1  Patient was given the opportunity to ask questions.  Patient verbalized understanding of the plan and was able to repeat key elements of the plan. Patient was given clear instructions to go to Emergency Department or return to medical center if symptoms don't improve, worsen, or new problems develop.The patient verbalized understanding.   Orders Placed This Encounter  Procedures   Basic Metabolic Panel   Hemoglobin A1c   Lipid panel     Requested Prescriptions   Signed Prescriptions Disp Refills   amLODipine (NORVASC) 10 MG tablet 90 tablet 0    Sig: Take 1 tablet (10 mg total) by mouth daily.   atorvastatin (LIPITOR) 20 MG tablet 90 tablet 0    Sig: Take 1 tablet (20 mg total) by mouth daily.   omeprazole (PRILOSEC) 40 MG capsule 90 capsule 0    Sig: Take 1 capsule (40 mg total) by mouth daily.   cyclobenzaprine (FLEXERIL) 5 MG tablet 90 tablet 1    Sig: Take 1 tablet (5 mg total) by mouth 3 (three) times daily as needed for muscle spasms.    No follow-ups on file.  Rema Fendt, NP

## 2023-10-02 LAB — BASIC METABOLIC PANEL
BUN/Creatinine Ratio: 7 — ABNORMAL LOW (ref 10–24)
BUN: 9 mg/dL (ref 8–27)
CO2: 25 mmol/L (ref 20–29)
Calcium: 9.8 mg/dL (ref 8.6–10.2)
Chloride: 101 mmol/L (ref 96–106)
Creatinine, Ser: 1.23 mg/dL (ref 0.76–1.27)
Glucose: 52 mg/dL — ABNORMAL LOW (ref 70–99)
Potassium: 4.1 mmol/L (ref 3.5–5.2)
Sodium: 142 mmol/L (ref 134–144)
eGFR: 67 mL/min/{1.73_m2} (ref >59)

## 2023-10-02 LAB — HEMOGLOBIN A1C
Est. average glucose Bld gHb Est-mCnc: 123 mg/dL
Hgb A1c MFr Bld: 5.9 % — ABNORMAL HIGH (ref 4.8–5.6)

## 2023-10-02 LAB — LIPID PANEL
Chol/HDL Ratio: 2.6 {ratio} (ref 0.0–5.0)
Cholesterol, Total: 124 mg/dL (ref 100–199)
HDL: 47 mg/dL (ref >39)
LDL Chol Calc (NIH): 61 mg/dL (ref 0–99)
Triglycerides: 81 mg/dL (ref 0–149)
VLDL Cholesterol Cal: 16 mg/dL (ref 5–40)

## 2024-01-02 ENCOUNTER — Encounter: Payer: Self-pay | Admitting: Family

## 2024-01-02 ENCOUNTER — Ambulatory Visit (INDEPENDENT_AMBULATORY_CARE_PROVIDER_SITE_OTHER): Payer: Self-pay | Admitting: Family

## 2024-01-02 VITALS — BP 121/80 | HR 82 | Temp 98.2°F | Ht 69.5 in | Wt 176.6 lb

## 2024-01-02 DIAGNOSIS — K219 Gastro-esophageal reflux disease without esophagitis: Secondary | ICD-10-CM

## 2024-01-02 DIAGNOSIS — E785 Hyperlipidemia, unspecified: Secondary | ICD-10-CM

## 2024-01-02 DIAGNOSIS — I10 Essential (primary) hypertension: Secondary | ICD-10-CM | POA: Diagnosis not present

## 2024-01-02 DIAGNOSIS — M62830 Muscle spasm of back: Secondary | ICD-10-CM

## 2024-01-02 MED ORDER — OMEPRAZOLE 40 MG PO CPDR: 40.0000 mg | DELAYED_RELEASE_CAPSULE | Freq: Every day | ORAL | 0 refills | Status: DC

## 2024-01-02 MED ORDER — AMLODIPINE BESYLATE 10 MG PO TABS: 10.0000 mg | ORAL_TABLET | Freq: Every day | ORAL | 0 refills | Status: DC

## 2024-01-02 MED ORDER — CYCLOBENZAPRINE HCL 5 MG PO TABS: 5.0000 mg | ORAL_TABLET | Freq: Three times a day (TID) | ORAL | 1 refills | Status: DC | PRN

## 2024-01-02 MED ORDER — ATORVASTATIN CALCIUM 20 MG PO TABS: 20.0000 mg | ORAL_TABLET | Freq: Every day | ORAL | 0 refills | Status: DC

## 2024-01-02 NOTE — Progress Notes (Signed)
 Patient stated he thinks he is here for a regular routine check up.

## 2024-01-02 NOTE — Progress Notes (Signed)
 Patient ID: Noah Avila, male    DOB: 23-Nov-1961  MRN: [id number]  CC: Chronic Conditions Follow-Up  Subjective: Noah Avila is a 63 y.o. male who presents for chronic conditions follow-up.   His concerns today include:  - Doing well on Amlodipine , no issues/concerns. He does not complain of red flag symptoms such as but not limited to chest pain, shortness of breath, worst headache of life, nausea/vomiting.  - Doing well on Atorvastatin , no issues/concerns.  - Doing well on Omeprazole , no issues/concerns. - Doing well on Cyclobenzaprine , no issues/concerns.   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 20 tablet 0   ondansetron  (ZOFRAN -ODT) 4 MG disintegrating tablet Take 1 tablet (4 mg total) by mouth every 8 (eight) hours as needed for nausea or vomiting. (Patient not taking: Reported on 01/02/2024) 20 tablet 0   No current facility-administered medications on file prior to visit.    No Known Allergies  Social History   Socioeconomic History   Marital status: Married    Spouse name: Not on file   Number of children: Not on file   Years of education: Not on file   Highest education level: Not on file  Occupational History   Not on file  Tobacco Use   Smoking status: Never    Passive exposure: Never   Smokeless tobacco: Never  Substance and Sexual Activity   Alcohol use: Not Currently   Drug use: No   Sexual activity: Yes  Other Topics Concern   Not on file  Social History Narrative   Not on file   Social Drivers of Health   Financial Resource Strain: Low Risk  (10/01/2023)   Overall Financial  Resource Strain (CARDIA)    Difficulty of Paying Living Expenses: Not very hard  Food Insecurity: No Food Insecurity (10/01/2023)   Hunger Vital Sign    Worried About Running Out of Food in the Last Year: Never true    Ran Out of Food in the Last Year: Never true  Transportation Needs: No Transportation Needs (10/01/2023)   PRAPARE - Administrator, Civil Service (Medical): No    Lack of Transportation (Non-Medical): No  Physical Activity: Inactive (10/01/2023)   Exercise Vital Sign    Days of Exercise per Week: 0 days    Minutes of Exercise per Session: 0 min  Stress: No Stress Concern Present (10/01/2023)   [employer] of Occupational Health - Occupational Stress Questionnaire    Feeling of Stress : Not at all  Social Connections: Moderately Isolated (10/01/2023)   Social Connection and Isolation Panel [NHANES]    Frequency of Communication with Friends and Family: Three times a week    Frequency of Social Gatherings with Friends and Family: Three times a week    Attends Religious Services: More than 4 times per year    Active Member of Clubs or Organizations: No    Attends Banker Meetings: Never    Marital Status: Never married  Intimate Partner Violence: Not At Risk (10/01/2023)   Humiliation, Afraid, Rape, and Kick questionnaire    Fear of Current or  Ex-Partner: No    Emotionally Abused: No    Physically Abused: No    Sexually Abused: No    Family History  Problem Relation Age of Onset   Coronary artery disease Mother    Lung cancer Mother    Renal Disease Father    Diabetes Father    Heart disease Father    Heart disease Brother     Past Surgical History:  Procedure Laterality Date   NO PAST SURGERIES      ROS: Review of Systems Negative except as stated above  PHYSICAL EXAM: BP 121/80   Pulse 82   Temp 98.2 F (36.8 C) (Oral)   Ht 5' 9.5 (1.765 m)   Wt 176 lb 9.6 oz (80.1 kg)   SpO2 93%   BMI 25.71 kg/m   Physical  Exam HENT:     Head: Normocephalic and atraumatic.     Nose: Nose normal.     Mouth/Throat:     Mouth: Mucous membranes are moist.     Pharynx: Oropharynx is clear.  Eyes:     Extraocular Movements: Extraocular movements intact.     Conjunctiva/sclera: Conjunctivae normal.     Pupils: Pupils are equal, round, and reactive to light.  Cardiovascular:     Rate and Rhythm: Normal rate and regular rhythm.     Pulses: Normal pulses.     Heart sounds: Normal heart sounds.  Pulmonary:     Effort: Pulmonary effort is normal.     Breath sounds: Normal breath sounds.  Musculoskeletal:        General: Normal range of motion.     Cervical back: Normal range of motion and neck supple.  Neurological:     General: No focal deficit present.     Mental Status: He is alert and oriented to person, place, and time.  Psychiatric:        Mood and Affect: Mood normal.        Behavior: Behavior normal.     ASSESSMENT AND PLAN: 1. Primary hypertension (Primary) - Continue Amlodipine  as prescribed.  - Counseled on blood pressure goal of less than 130/80, low-sodium, DASH diet, medication compliance, and 150 minutes of moderate intensity exercise per week as tolerated. Counseled on medication adherence and adverse effects. - Follow-up with primary provider in 3 months or sooner if needed.  - amLODipine  (NORVASC ) 10 MG tablet; Take 1 tablet (10 mg total) by mouth daily.  Dispense: 90 tablet; Refill: 0  2. Hyperlipidemia, unspecified hyperlipidemia type - Continue Atorvastatin  as prescribed. Counseled on medication adherence/adverse effects.  - Follow-up with primary provider in 3 months or sooner if needed.  - atorvastatin  (LIPITOR) 20 MG tablet; Take 1 tablet (20 mg total) by mouth daily.  Dispense: 90 tablet; Refill: 0  3. Gastroesophageal reflux disease, unspecified whether esophagitis present - Continue Omeprazole  as prescribed. Counseled on medication adherence/adverse effects.  - Follow-up with  primary provider in 3 months or sooner if needed.  - omeprazole  (PRILOSEC) 40 MG capsule; Take 1 capsule (40 mg total) by mouth daily.  Dispense: 90 capsule; Refill: 0  4. Spasm of back muscles - Continue Cyclobenzaprine  as prescribed. Counseled on medication adherence/adverse effects.  - Follow-up with primary provider in 3 months or sooner if needed.  - cyclobenzaprine  (FLEXERIL ) 5 MG tablet; Take 1 tablet (5 mg total) by mouth 3 (three) times daily as needed for muscle spasms.  Dispense: 90 tablet; Refill: 1   Patient was given the opportunity to ask questions.  Patient verbalized understanding of the plan and was able to repeat key elements of the plan. Patient was given clear instructions to go to Emergency Department or return to medical center if symptoms don't improve, worsen, or new problems develop.The patient verbalized understanding.   Requested Prescriptions   Signed Prescriptions Disp Refills   amLODipine  (NORVASC ) 10 MG tablet 90 tablet 0    Sig: Take 1 tablet (10 mg total) by mouth daily.   atorvastatin  (LIPITOR) 20 MG tablet 90 tablet 0    Sig: Take 1 tablet (20 mg total) by mouth daily.   cyclobenzaprine  (FLEXERIL ) 5 MG tablet 90 tablet 1    Sig: Take 1 tablet (5 mg total) by mouth 3 (three) times daily as needed for muscle spasms.   omeprazole  (PRILOSEC) 40 MG capsule 90 capsule 0    Sig: Take 1 capsule (40 mg total) by mouth daily.    Return in about 3 months (around 04/01/2024) for Follow-Up or next available chronic conditions.  Greig JINNY Drones, NP

## 2024-04-01 ENCOUNTER — Encounter: Payer: Self-pay | Admitting: Family

## 2024-04-01 ENCOUNTER — Ambulatory Visit (INDEPENDENT_AMBULATORY_CARE_PROVIDER_SITE_OTHER): Payer: Self-pay | Admitting: Family

## 2024-04-01 VITALS — BP 121/81 | HR 71 | Temp 98.2°F | Resp 16 | Ht 69.0 in | Wt 178.0 lb

## 2024-04-01 DIAGNOSIS — K219 Gastro-esophageal reflux disease without esophagitis: Secondary | ICD-10-CM

## 2024-04-01 DIAGNOSIS — R7303 Prediabetes: Secondary | ICD-10-CM

## 2024-04-01 DIAGNOSIS — E785 Hyperlipidemia, unspecified: Secondary | ICD-10-CM

## 2024-04-01 DIAGNOSIS — I10 Essential (primary) hypertension: Secondary | ICD-10-CM | POA: Diagnosis not present

## 2024-04-01 DIAGNOSIS — M62830 Muscle spasm of back: Secondary | ICD-10-CM | POA: Diagnosis not present

## 2024-04-01 MED ORDER — OMEPRAZOLE 40 MG PO CPDR: 40.0000 mg | DELAYED_RELEASE_CAPSULE | Freq: Every day | ORAL | 0 refills | Status: DC

## 2024-04-01 MED ORDER — ATORVASTATIN CALCIUM 20 MG PO TABS: 20.0000 mg | ORAL_TABLET | Freq: Every day | ORAL | 0 refills | Status: DC

## 2024-04-01 MED ORDER — AMLODIPINE BESYLATE 10 MG PO TABS: 10.0000 mg | ORAL_TABLET | Freq: Every day | ORAL | 0 refills | Status: DC

## 2024-04-01 MED ORDER — CYCLOBENZAPRINE HCL 5 MG PO TABS: 5.0000 mg | ORAL_TABLET | Freq: Three times a day (TID) | ORAL | 1 refills | Status: DC | PRN

## 2024-04-01 NOTE — Progress Notes (Signed)
 3 month follow up,  has something in his finger

## 2024-04-01 NOTE — Progress Notes (Signed)
 Patient ID: Noah Avila, male    DOB: 1961/04/29  MRN: [id number]  CC: Chronic Conditions Follow-Up  Subjective: Noah Avila is a 63 y.o. male who presents for chronic conditions follow-up.   His concerns today include:  - Doing well on Amlodipine, no issues/concerns. He does not complain of red flag symptoms such as but not limited to chest pain, shortness of breath, worst headache of life, nausea/vomiting.  - Doing well on Atorvastatin, no issues/concerns.  - Doing well on Omeprazole, no issues/concerns.  - Doing well on Cyclobenzaprine, no issues/concerns.  - States splinter of right hand from yard work. Denies red flag symptoms. Exam unremarkable. I discussed conservative measures with patient. Follow-up as scheduled.  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 20 tablet 0   ondansetron (ZOFRAN-ODT) 4 MG disintegrating tablet Take 1 tablet (4 mg total) by mouth every 8 (eight) hours as needed for nausea or vomiting. (Patient not taking: Reported on 01/02/2024) 20 tablet 0   No current facility-administered medications on file prior to visit.    No Known Allergies  Social History   Socioeconomic History   Marital status: Married    Spouse name: Not on file   Number of children: Not on file   Years of education: Not on file   Highest education level: Not on file  Occupational History   Not on file  Tobacco Use   Smoking status: Never    Passive exposure: Never   Smokeless tobacco: Never  Vaping Use   Vaping status: Never Used  Substance and Sexual Activity   Alcohol use: Not Currently   Drug use: No    Sexual activity: Yes  Other Topics Concern   Not on file  Social History Narrative   Not on file   Social Drivers of Health   Financial Resource Strain: Low Risk  (10/01/2023)   Overall Financial Resource Strain (CARDIA)    Difficulty of Paying Living Expenses: Not very hard  Food Insecurity: No Food Insecurity (10/01/2023)   Hunger Vital Sign    Worried About Running Out of Food in the Last Year: Never true    Ran Out of Food in the Last Year: Never true  Transportation Needs: No Transportation Needs (10/01/2023)   PRAPARE - Administrator, Civil Service (Medical): No    Lack of Transportation (Non-Medical): No  Physical Activity: Inactive (10/01/2023)   Exercise Vital Sign    Days of Exercise per Week: 0 days    Minutes of Exercise per Session: 0 min  Stress: No Stress Concern Present (10/01/2023)   [employer] of Occupational Health - Occupational Stress Questionnaire    Feeling of Stress : Not at all  Social Connections: Moderately Isolated (10/01/2023)   Social Connection and Isolation Panel [NHANES]    Frequency of Communication with Friends and Family: Three times a week    Frequency of Social Gatherings with Friends and Family: Three times a week    Attends Religious Services: More than 4 times per year    Active Member of Clubs or Organizations: No    Attends Club  or Organization Meetings: Never    Marital Status: Never married  Intimate Partner Violence: Not At Risk (10/01/2023)   Humiliation, Afraid, Rape, and Kick questionnaire    Fear of Current or Ex-Partner: No    Emotionally Abused: No    Physically Abused: No    Sexually Abused: No    Family History  Problem Relation Age of Onset   Coronary artery disease Mother    Lung cancer Mother    Renal Disease Father    Diabetes Father    Heart disease Father    Heart disease Brother     Past Surgical History:  Procedure Laterality Date   NO PAST SURGERIES      ROS: Review of  Systems Negative except as stated above  PHYSICAL EXAM: BP 121/81   Pulse 71   Temp 98.2 F (36.8 C) (Oral)   Resp 16   Ht 5\' 9"  (1.753 m)   Wt 178 lb (80.7 kg)   SpO2 95%   BMI 26.29 kg/m   Physical Exam HENT:     Head: Normocephalic and atraumatic.     Nose: Nose normal.     Mouth/Throat:     Mouth: Mucous membranes are moist.     Pharynx: Oropharynx is clear.  Eyes:     Extraocular Movements: Extraocular movements intact.     Conjunctiva/sclera: Conjunctivae normal.     Pupils: Pupils are equal, round, and reactive to light.  Cardiovascular:     Rate and Rhythm: Normal rate and regular rhythm.     Pulses: Normal pulses.     Heart sounds: Normal heart sounds.  Pulmonary:     Effort: Pulmonary effort is normal.     Breath sounds: Normal breath sounds.  Musculoskeletal:        General: Normal range of motion.     Cervical back: Normal range of motion and neck supple.  Neurological:     General: No focal deficit present.     Mental Status: He is alert and oriented to person, place, and time.  Psychiatric:        Mood and Affect: Mood normal.        Behavior: Behavior normal.     ASSESSMENT AND PLAN: 1. Primary hypertension (Primary) - Continue Amlodipine as prescribed. - Routine screening.  - Counseled on blood pressure goal of less than 130/80, low-sodium, DASH diet, medication compliance, and 150 minutes of moderate intensity exercise per week as tolerated. Counseled on medication adherence and adverse effects. - Follow-up with primary provider in 3 months or sooner if needed. - amLODipine (NORVASC) 10 MG tablet; Take 1 tablet (10 mg total) by mouth daily.  Dispense: 90 tablet; Refill: 0 - Basic Metabolic Panel  2. Hyperlipidemia, unspecified hyperlipidemia type - Continue Atorvastatin as prescribed. Counseled on medication adherence/adverse effects.  - Follow-up with primary provider in 3 months or sooner if needed. - atorvastatin (LIPITOR) 20 MG tablet;  Take 1 tablet (20 mg total) by mouth daily.  Dispense: 90 tablet; Refill: 0  3. Gastroesophageal reflux disease, unspecified whether esophagitis present - Continue Omeprazole as prescribed. Counseled on medication adherence/adverse effects.  - Follow-up with primary provider in 3 months or sooner if needed. - omeprazole (PRILOSEC) 40 MG capsule; Take 1 capsule (40 mg total) by mouth daily.  Dispense: 90 capsule; Refill: 0  4. Spasm of back muscles - Continue Cyclobenzaprine as prescribed. Counseled on medication adherence/adverse effects.  - Follow-up with primary provider in 3 months or sooner if needed. -  cyclobenzaprine (FLEXERIL) 5 MG tablet; Take 1 tablet (5 mg total) by mouth 3 (three) times daily as needed for muscle spasms.  Dispense: 90 tablet; Refill: 1  5. Prediabetes - Routine screening.  - Hemoglobin A1c    Patient was given the opportunity to ask questions.  Patient verbalized understanding of the plan and was able to repeat key elements of the plan. Patient was given clear instructions to go to Emergency Department or return to medical center if symptoms don't improve, worsen, or new problems develop.The patient verbalized understanding.   Orders Placed This Encounter  Procedures   Basic Metabolic Panel   Hemoglobin A1c     Requested Prescriptions   Signed Prescriptions Disp Refills   amLODipine (NORVASC) 10 MG tablet 90 tablet 0    Sig: Take 1 tablet (10 mg total) by mouth daily.   atorvastatin (LIPITOR) 20 MG tablet 90 tablet 0    Sig: Take 1 tablet (20 mg total) by mouth daily.   cyclobenzaprine (FLEXERIL) 5 MG tablet 90 tablet 1    Sig: Take 1 tablet (5 mg total) by mouth 3 (three) times daily as needed for muscle spasms.   omeprazole (PRILOSEC) 40 MG capsule 90 capsule 0    Sig: Take 1 capsule (40 mg total) by mouth daily.    Return in about 3 months (around 07/01/2024) for Follow-Up or next available chronic conditions.  Rema Fendt, NP

## 2024-04-02 ENCOUNTER — Other Ambulatory Visit: Payer: Self-pay | Admitting: Family

## 2024-04-02 DIAGNOSIS — M62830 Muscle spasm of back: Secondary | ICD-10-CM

## 2024-04-02 DIAGNOSIS — I10 Essential (primary) hypertension: Secondary | ICD-10-CM

## 2024-04-02 LAB — BASIC METABOLIC PANEL WITH GFR
BUN/Creatinine Ratio: 9 — ABNORMAL LOW (ref 10–24)
BUN: 11 mg/dL (ref 8–27)
CO2: 22 mmol/L (ref 20–29)
Calcium: 9.4 mg/dL (ref 8.6–10.2)
Chloride: 103 mmol/L (ref 96–106)
Creatinine, Ser: 1.26 mg/dL (ref 0.76–1.27)
Glucose: 115 mg/dL — ABNORMAL HIGH (ref 70–99)
Potassium: 4.3 mmol/L (ref 3.5–5.2)
Sodium: 141 mmol/L (ref 134–144)
eGFR: 65 mL/min/{1.73_m2} (ref >59)

## 2024-04-02 LAB — HEMOGLOBIN A1C
Est. average glucose Bld gHb Est-mCnc: 123 mg/dL
Hgb A1c MFr Bld: 5.9 % — ABNORMAL HIGH (ref 4.8–5.6)

## 2024-04-19 IMAGING — CT CT ABD-PELV W/ CM
2 of 5 series · 16 of 46 positions shown, 18 images · IV contrast (Omnipaque)
Comparison: June 03, 2021

CLINICAL DATA: Abdominal pain with nausea vomiting and diarrhea.

EXAM:
CT ABDOMEN AND PELVIS WITH CONTRAST
TECHNIQUE: Multidetector CT imaging of the abdomen and pelvis was performed
using the standard protocol following bolus administration of
intravenous contrast.

[Series 2: axial st · axial · 0.84mm/px · z∈[-514,-114]mm · 13 of 90 slices shown, 15 images]
[im 5/90  soft-tissue]
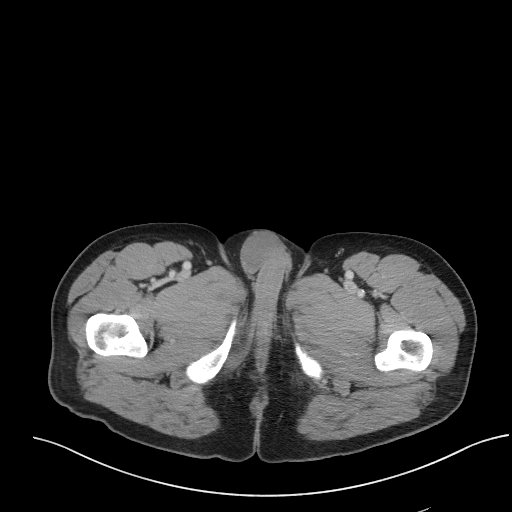
[im 5/90  bone]
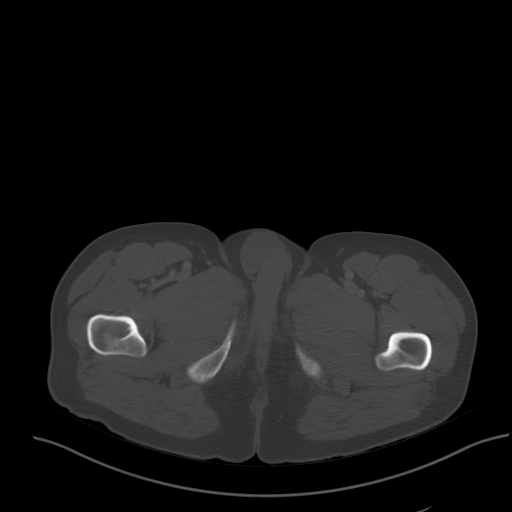
[im 10/90  soft-tissue]
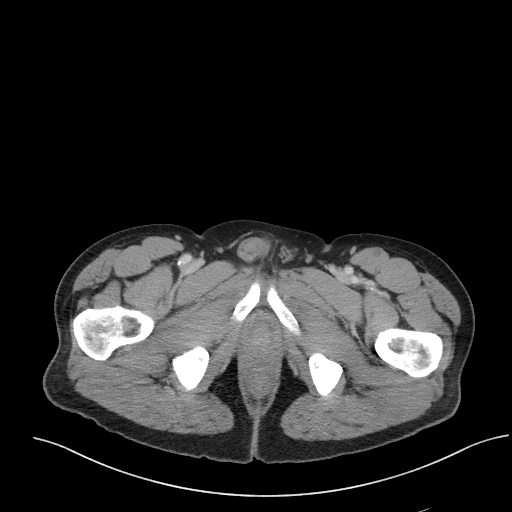
[im 20/90  soft-tissue]
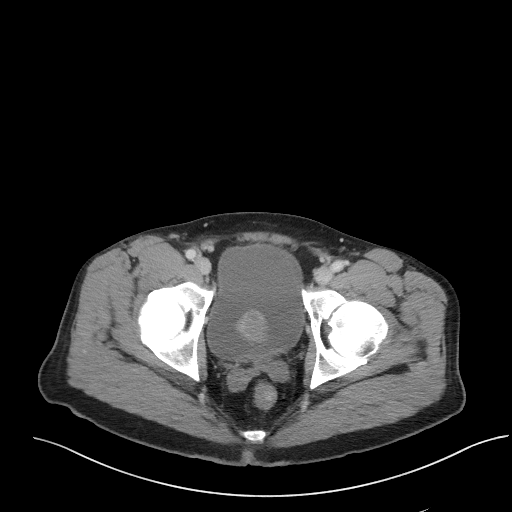
[im 25/90  soft-tissue]
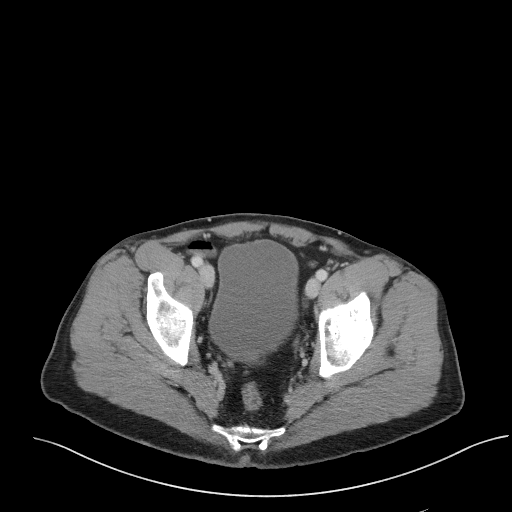
[im 30/90  soft-tissue]
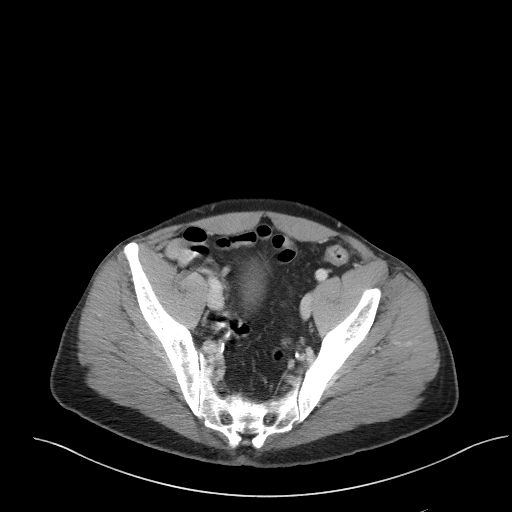
[im 40/90  soft-tissue]
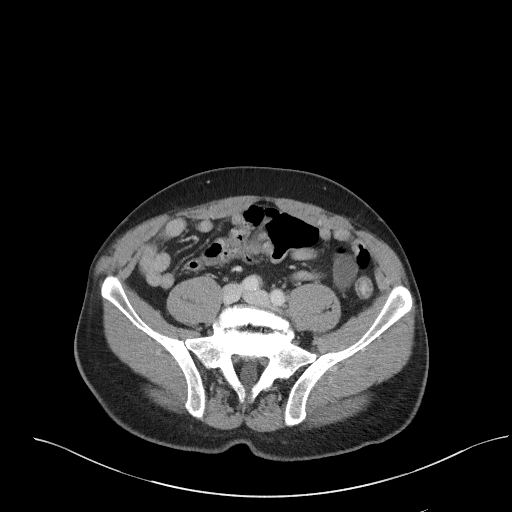
[im 45/90  soft-tissue]
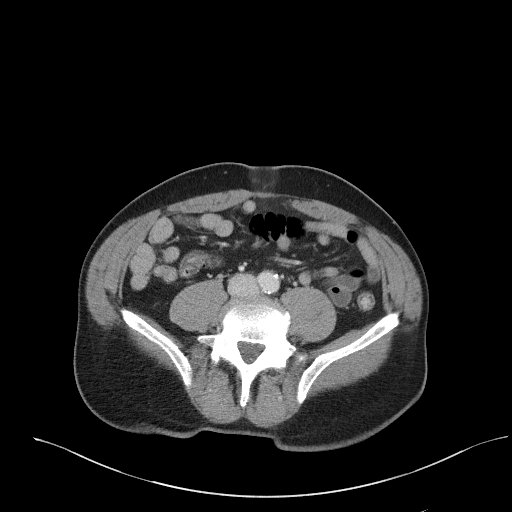
[im 50/90  soft-tissue]
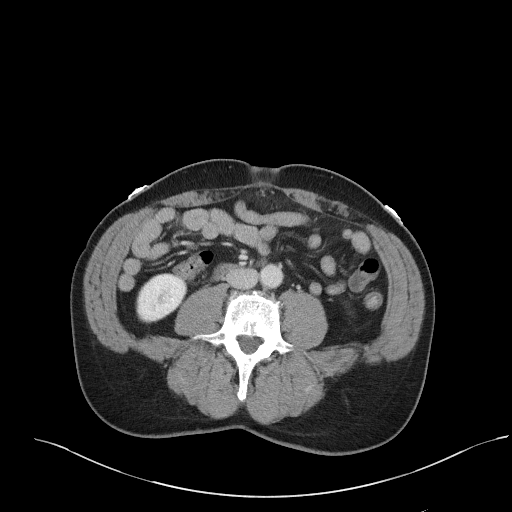
[im 60/90  soft-tissue]
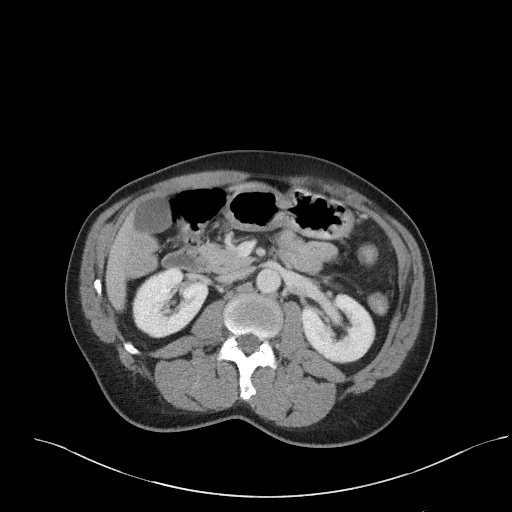
[im 60/90  bone]
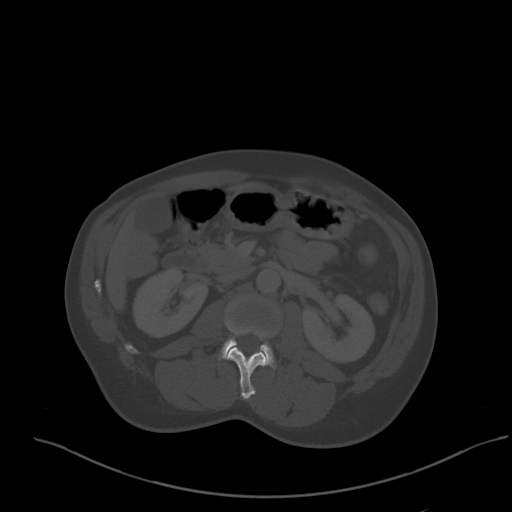
[im 65/90  soft-tissue]
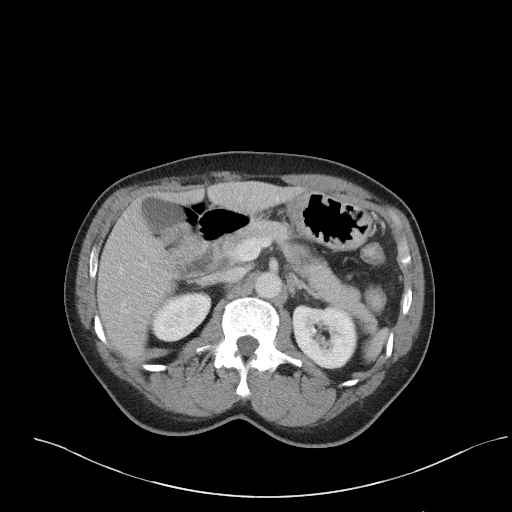
[im 70/90  soft-tissue]
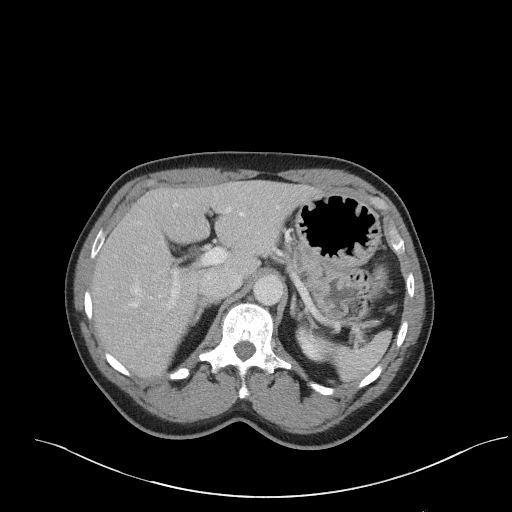
[im 80/90  soft-tissue]
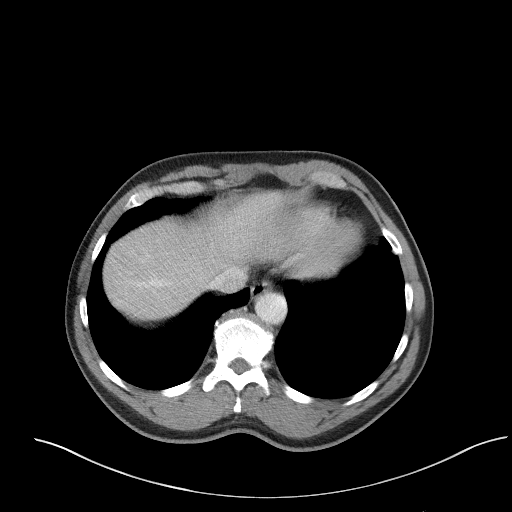
[im 85/90  soft-tissue]
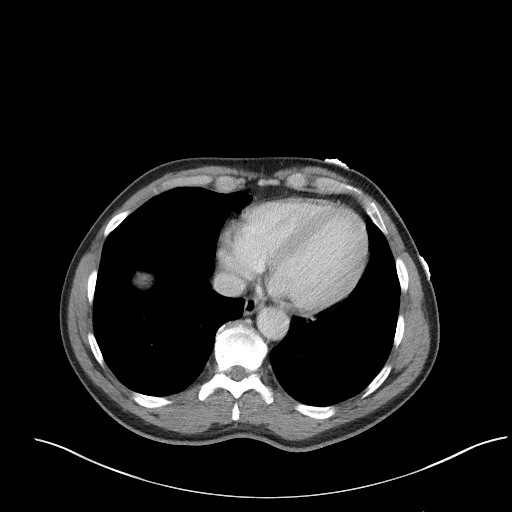

[Series 5: coronal st · coronal · 0.69mm/px · 3 of 114 slices shown]
[im 38/114  soft-tissue]
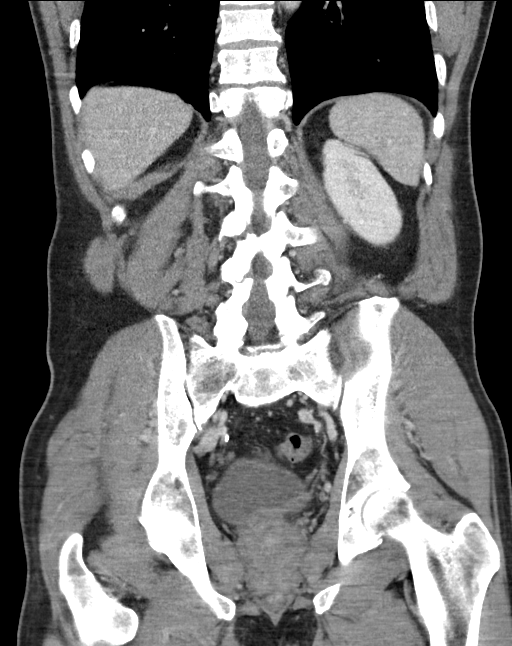
[im 51/114  soft-tissue]
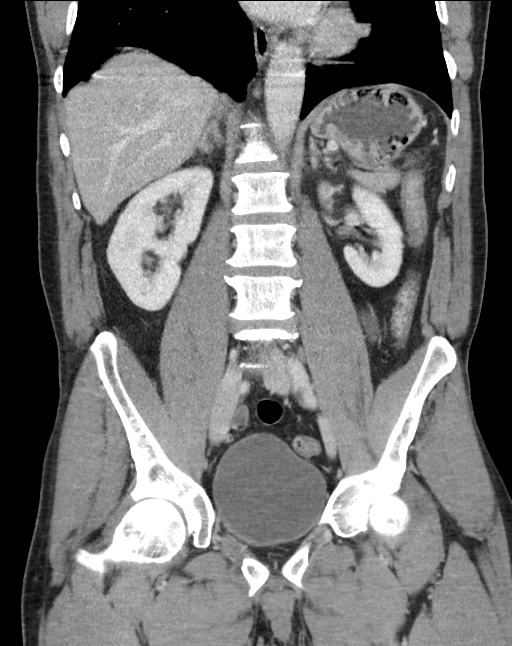
[im 63/114  soft-tissue]
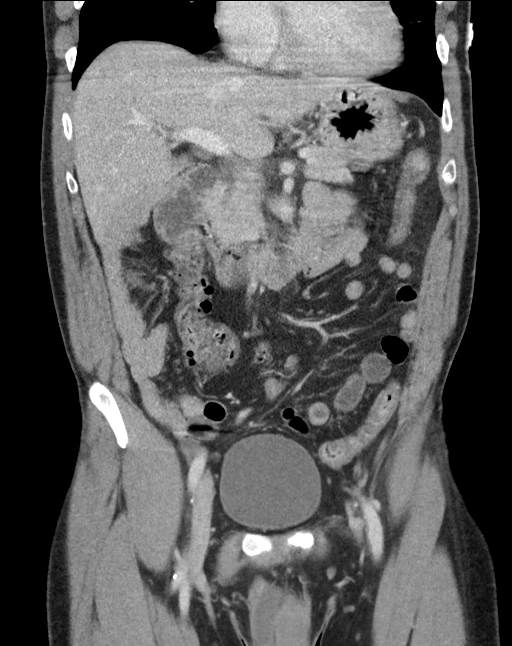

[16 of 46 positions shown; findings below may reference images not displayed]

RADIATION DOSE REDUCTION: This exam was performed according to the
departmental dose-optimization program which includes automated
exposure control, adjustment of the mA and/or kV according to
patient size and/or use of iterative reconstruction technique.

CONTRAST:  100mL OMNIPAQUE IOHEXOL 300 MG/ML  SOLN
FINDINGS: Lower chest: Atelectasis versus scarring in the lingula.

Hepatobiliary: No focal liver abnormality is seen. No gallstones,
gallbladder wall thickening, or biliary dilatation.

Pancreas: Unremarkable. No pancreatic ductal dilatation or
surrounding inflammatory changes.

Spleen: Normal in size without focal abnormality.

Adrenals/Urinary Tract: Adrenal glands are unremarkable. Kidneys are
normal, without renal calculi, focal lesion, or hydronephrosis.
Bladder is unremarkable.

Stomach/Bowel: Stomach is within normal limits. Appendix appears
normal. No evidence of bowel wall thickening, distention, or
inflammatory changes.

Vascular/Lymphatic: Aortic atherosclerosis. No enlarged abdominal or
pelvic lymph nodes.

Reproductive: The prostate is irregularly enlarged with prominent
protrusion into the base of the urinary bladder.

Other: No abdominal wall hernia or abnormality. No abdominopelvic
ascites.

Musculoskeletal: L5-S1 spondylosis.
IMPRESSION: 1. No acute abnormalities within the abdomen or pelvis.
2. Irregularly enlarged prostate gland with prominent protrusion
into the base of the urinary bladder. Please correlate to patient's
PSA values.

Aortic Atherosclerosis (WIMXQ-BTX.X).

## 2024-07-01 ENCOUNTER — Ambulatory Visit (INDEPENDENT_AMBULATORY_CARE_PROVIDER_SITE_OTHER): Admitting: Family

## 2024-07-01 ENCOUNTER — Encounter: Payer: Self-pay | Admitting: Family

## 2024-07-01 VITALS — BP 121/80 | HR 70 | Temp 98.0°F | Resp 16 | Ht 69.0 in | Wt 176.2 lb

## 2024-07-01 DIAGNOSIS — I10 Essential (primary) hypertension: Secondary | ICD-10-CM | POA: Diagnosis not present

## 2024-07-01 DIAGNOSIS — K219 Gastro-esophageal reflux disease without esophagitis: Secondary | ICD-10-CM

## 2024-07-01 DIAGNOSIS — E785 Hyperlipidemia, unspecified: Secondary | ICD-10-CM | POA: Diagnosis not present

## 2024-07-01 DIAGNOSIS — M62830 Muscle spasm of back: Secondary | ICD-10-CM | POA: Diagnosis not present

## 2024-07-01 MED ORDER — AMLODIPINE BESYLATE 10 MG PO TABS: 10.0000 mg | ORAL_TABLET | Freq: Every day | ORAL | 0 refills | Status: DC

## 2024-07-01 MED ORDER — CYCLOBENZAPRINE HCL 5 MG PO TABS
5.0000 mg | ORAL_TABLET | Freq: Three times a day (TID) | ORAL | 1 refills | Status: DC | PRN
Start: 2024-07-01 — End: 2024-10-06

## 2024-07-01 MED ORDER — ATORVASTATIN CALCIUM 20 MG PO TABS: 20.0000 mg | ORAL_TABLET | Freq: Every day | ORAL | 0 refills | Status: DC

## 2024-07-01 MED ORDER — OMEPRAZOLE 40 MG PO CPDR: 40.0000 mg | DELAYED_RELEASE_CAPSULE | Freq: Every day | ORAL | 0 refills | Status: DC

## 2024-07-01 NOTE — Progress Notes (Signed)
 Patient ID: Noah Avila, male    DOB: 10/14/1961  MRN: [id number]  CC: Chronic Conditions Follow-Up  Subjective: Noah Avila is a 63 y.o. male who presents for chronic conditions follow-up.   His concerns today include:  - Doing well on Amlodipine , no issues/concerns. He does not complain of red flag symptoms such as but not limited to chest pain, shortness of breath, worst headache of life, nausea/vomiting.  - Doing well on Atorvastatin , no issues/concerns.  - Doing well on Omeprazole , no issues/concerns.  - Doing well on Cyclobenzaprine , no issues/concerns.  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 20 tablet 0   ondansetron  (ZOFRAN -ODT) 4 MG disintegrating tablet Take 1 tablet (4 mg total) by mouth every 8 (eight) hours as needed for nausea or vomiting. (Patient not taking: Reported on 01/02/2024) 20 tablet 0   No current facility-administered medications on file prior to visit.    No Known Allergies  Social History   Socioeconomic History   Marital status: Married    Spouse name: Not on file   Number of children: Not on file   Years of education: Not on file   Highest education level: Not on file  Occupational History   Not on file  Tobacco Use   Smoking status: Never    Passive exposure: Never   Smokeless tobacco: Never  Vaping Use   Vaping status: Never Used  Substance and Sexual Activity   Alcohol use: Not Currently   Drug use: No   Sexual activity: Yes  Other Topics Concern   Not on file  Social History Narrative   Not on file   Social Drivers of Health   Financial Resource Strain: Low  Risk  (10/01/2023)   Overall Financial Resource Strain (CARDIA)    Difficulty of Paying Living Expenses: Not very hard  Food Insecurity: No Food Insecurity (10/01/2023)   Hunger Vital Sign    Worried About Running Out of Food in the Last Year: Never true    Ran Out of Food in the Last Year: Never true  Transportation Needs: No Transportation Needs (10/01/2023)   PRAPARE - Administrator, Civil Service (Medical): No    Lack of Transportation (Non-Medical): No  Physical Activity: Inactive (10/01/2023)   Exercise Vital Sign    Days of Exercise per Week: 0 days    Minutes of Exercise per Session: 0 min  Stress: No Stress Concern Present (10/01/2023)   [employer] of Occupational Health - Occupational Stress Questionnaire    Feeling of Stress : Not at all  Social Connections: Moderately Isolated (10/01/2023)   Social Connection and Isolation Panel    Frequency of Communication with Friends and Family: Three times a week    Frequency of Social Gatherings with Friends and Family: Three times a week    Attends Religious Services: More than 4 times per year    Active Member of Clubs or Organizations: No    Attends Banker Meetings: Never    Marital Status: Never married  Intimate Partner Violence: Not At Risk (10/01/2023)   Humiliation, Afraid, Rape, and Kick  questionnaire    Fear of Current or Ex-Partner: No    Emotionally Abused: No    Physically Abused: No    Sexually Abused: No    Family History  Problem Relation Age of Onset   Coronary artery disease Mother    Lung cancer Mother    Renal Disease Father    Diabetes Father    Heart disease Father    Heart disease Brother     Past Surgical History:  Procedure Laterality Date   NO PAST SURGERIES      ROS: Review of Systems Negative except as stated above  PHYSICAL EXAM: BP 121/80   Pulse 70   Temp 98 F (36.7 C) (Oral)   Resp 16   Ht 5' 9 (1.753 m)   Wt 176 lb 3.2 oz (79.9 kg)   SpO2 95%    BMI 26.02 kg/m   Physical Exam HENT:     Head: Normocephalic and atraumatic.     Nose: Nose normal.     Mouth/Throat:     Mouth: Mucous membranes are moist.     Pharynx: Oropharynx is clear.  Eyes:     Extraocular Movements: Extraocular movements intact.     Conjunctiva/sclera: Conjunctivae normal.     Pupils: Pupils are equal, round, and reactive to light.  Cardiovascular:     Rate and Rhythm: Normal rate and regular rhythm.     Pulses: Normal pulses.     Heart sounds: Normal heart sounds.  Pulmonary:     Effort: Pulmonary effort is normal.     Breath sounds: Normal breath sounds.  Musculoskeletal:        General: Normal range of motion.     Cervical back: Normal range of motion and neck supple.  Neurological:     General: No focal deficit present.     Mental Status: He is alert and oriented to person, place, and time.  Psychiatric:        Mood and Affect: Mood normal.        Behavior: Behavior normal.     ASSESSMENT AND PLAN: 1. Primary hypertension (Primary) - Continue Amlodipine  as prescribed.  - Counseled on blood pressure goal of less than 130/80, low-sodium, DASH diet, medication compliance, and 150 minutes of moderate intensity exercise per week as tolerated. Counseled on medication adherence and adverse effects. - Follow-up with primary provider in 3 months or sooner if needed. - amLODipine  (NORVASC ) 10 MG tablet; Take 1 tablet (10 mg total) by mouth daily.  Dispense: 90 tablet; Refill: 0  2. Hyperlipidemia, unspecified hyperlipidemia type - Continue Atorvastatin  as prescribed. Counseled on medication adherence/adverse effects.  - Follow-up with primary provider in 3 months or sooner if needed. - atorvastatin  (LIPITOR) 20 MG tablet; Take 1 tablet (20 mg total) by mouth daily.  Dispense: 90 tablet; Refill: 0  3. Gastroesophageal reflux disease, unspecified whether esophagitis present - Continue Omeprazole  as prescribed. Counseled on medication adherence/adverse  effects.  - Follow-up with primary provider in 3 months or sooner if needed. - omeprazole  (PRILOSEC) 40 MG capsule; Take 1 capsule (40 mg total) by mouth daily.  Dispense: 90 capsule; Refill: 0  4. Spasm of back muscles - Continue Cyclobenzaprine  as prescribed. Counseled on medication adherence/adverse effects.  - Follow-up with primary provider in 3 months or sooner if needed. - cyclobenzaprine  (FLEXERIL ) 5 MG tablet; Take 1 tablet (5 mg total) by mouth 3 (three) times daily as needed for muscle spasms.  Dispense: 90 tablet; Refill: 1   Patient  was given the opportunity to ask questions.  Patient verbalized understanding of the plan and was able to repeat key elements of the plan. Patient was given clear instructions to go to Emergency Department or return to medical center if symptoms don't improve, worsen, or new problems develop.The patient verbalized understanding.    Requested Prescriptions   Signed Prescriptions Disp Refills   amLODipine  (NORVASC ) 10 MG tablet 90 tablet 0    Sig: Take 1 tablet (10 mg total) by mouth daily.   atorvastatin  (LIPITOR) 20 MG tablet 90 tablet 0    Sig: Take 1 tablet (20 mg total) by mouth daily.   omeprazole  (PRILOSEC) 40 MG capsule 90 capsule 0    Sig: Take 1 capsule (40 mg total) by mouth daily.   cyclobenzaprine  (FLEXERIL ) 5 MG tablet 90 tablet 1    Sig: Take 1 tablet (5 mg total) by mouth 3 (three) times daily as needed for muscle spasms.    Return in about 3 months (around 10/01/2024) for Follow-Up or next available chronic conditions.  Greig JINNY Drones, NP

## 2024-07-01 NOTE — Progress Notes (Signed)
 3 month follow-up

## 2024-07-06 ENCOUNTER — Other Ambulatory Visit: Payer: Self-pay | Admitting: Family

## 2024-07-06 DIAGNOSIS — M62830 Muscle spasm of back: Secondary | ICD-10-CM

## 2024-10-06 ENCOUNTER — Ambulatory Visit (INDEPENDENT_AMBULATORY_CARE_PROVIDER_SITE_OTHER): Admitting: Family

## 2024-10-06 ENCOUNTER — Encounter: Payer: Self-pay | Admitting: Family

## 2024-10-06 VITALS — BP 128/86 | HR 80 | Temp 98.1°F | Resp 16 | Ht 69.0 in | Wt 170.6 lb

## 2024-10-06 DIAGNOSIS — Z1321 Encounter for screening for nutritional disorder: Secondary | ICD-10-CM

## 2024-10-06 DIAGNOSIS — I10 Essential (primary) hypertension: Secondary | ICD-10-CM | POA: Diagnosis not present

## 2024-10-06 DIAGNOSIS — E785 Hyperlipidemia, unspecified: Secondary | ICD-10-CM | POA: Diagnosis not present

## 2024-10-06 DIAGNOSIS — K219 Gastro-esophageal reflux disease without esophagitis: Secondary | ICD-10-CM

## 2024-10-06 DIAGNOSIS — M62830 Muscle spasm of back: Secondary | ICD-10-CM

## 2024-10-06 MED ORDER — OMEPRAZOLE 40 MG PO CPDR: 40.0000 mg | DELAYED_RELEASE_CAPSULE | Freq: Every day | ORAL | 0 refills | Status: DC

## 2024-10-06 MED ORDER — CYCLOBENZAPRINE HCL 7.5 MG PO TABS: 7.5000 mg | ORAL_TABLET | Freq: Three times a day (TID) | ORAL | 0 refills | Status: DC | PRN

## 2024-10-06 MED ORDER — AMLODIPINE BESYLATE 10 MG PO TABS: 10.0000 mg | ORAL_TABLET | Freq: Every day | ORAL | 0 refills | Status: DC

## 2024-10-06 MED ORDER — ATORVASTATIN CALCIUM 20 MG PO TABS: 20.0000 mg | ORAL_TABLET | Freq: Every day | ORAL | 0 refills | Status: DC

## 2024-10-06 NOTE — Progress Notes (Signed)
 3 month follow, patient  having muscle spasms when he works a lot  in the front  on both sides of his ribs and the back on both sides

## 2024-10-06 NOTE — Progress Notes (Signed)
 Patient ID: Noah Avila, male    DOB: Jun 12, 1961  MRN: [id number]  CC: Chronic Conditions Follow-Up  Subjective: Noah Avila is a 63 y.o. male who presents for chronic conditions follow-up.   His concerns today include: - Doing well on Amlodipine , no issues/concerns. He does not complain of red flag symptoms such as but not limited to chest pain, shortness of breath, worst headache of life, nausea/vomiting.  - Doing well on Atorvastatin , no issues/concerns.  - Doing well on Omeprazole , no issues/concerns.  - States muscle spasms worsening and episodes lasting several minutes. Denies red flag symptoms. States thinks related to his job. States he only takes Cyclobenzaprine  2 tablets once daily and not as prescribed Cyclobenzaprine  1 tablet three times daily.  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 20 tablet 0   ondansetron  (ZOFRAN -ODT) 4 MG disintegrating tablet Take 1 tablet (4 mg total) by mouth every 8 (eight) hours as needed for nausea or vomiting. 20 tablet 0   No current facility-administered medications on file prior to visit.    No Known Allergies  Social History   Socioeconomic History   Marital status: Married    Spouse name: Not on file   Number of children: Not on file   Years of education: Not on file   Highest education level: Not on file  Occupational History   Not on file  Tobacco Use   Smoking status: Never    Passive exposure: Never   Smokeless tobacco: Never  Vaping Use   Vaping status: Never Used  Substance and Sexual Activity   Alcohol use: Not Currently   Drug use: No   Sexual  activity: Yes  Other Topics Concern   Not on file  Social History Narrative   Not on file   Social Drivers of Health   Financial Resource Strain: Low Risk  (10/01/2023)   Overall Financial Resource Strain (CARDIA)    Difficulty of Paying Living Expenses: Not very hard  Food Insecurity: No Food Insecurity (10/01/2023)   Hunger Vital Sign    Worried About Running Out of Food in the Last Year: Never true    Ran Out of Food in the Last Year: Never true  Transportation Needs: No Transportation Needs (10/01/2023)   PRAPARE - Administrator, Civil Service (Medical): No    Lack of Transportation (Non-Medical): No  Physical Activity: Inactive (10/01/2023)   Exercise Vital Sign    Days of Exercise per Week: 0 days    Minutes of Exercise per Session: 0 min  Stress: No Stress Concern Present (10/01/2023)   [employer] of Occupational Health - Occupational Stress Questionnaire    Feeling of Stress : Not at all  Social Connections: Moderately Isolated (10/01/2023)   Social Connection and Isolation Panel    Frequency of Communication with Friends and Family: Three times a week    Frequency of Social Gatherings with Friends and Family: Three times a week    Attends Religious Services: More than 4 times per year    Active Member of Clubs or Organizations: No    Attends Club or  Organization Meetings: Never    Marital Status: Never married  Intimate Partner Violence: Not At Risk (10/01/2023)   Humiliation, Afraid, Rape, and Kick questionnaire    Fear of Current or Ex-Partner: No    Emotionally Abused: No    Physically Abused: No    Sexually Abused: No    Family History  Problem Relation Age of Onset   Coronary artery disease Mother    Lung cancer Mother    Renal Disease Father    Diabetes Father    Heart disease Father    Heart disease Brother     Past Surgical History:  Procedure Laterality Date   NO PAST SURGERIES      ROS: Review of Systems Negative except as  stated above  PHYSICAL EXAM: BP 128/86   Pulse 80   Temp 98.1 F (36.7 C) (Oral)   Resp 16   Ht 5' 9 (1.753 m)   Wt 170 lb 9.6 oz (77.4 kg)   SpO2 96%   BMI 25.19 kg/m   Physical Exam HENT:     Head: Normocephalic and atraumatic.     Nose: Nose normal.     Mouth/Throat:     Mouth: Mucous membranes are moist.     Pharynx: Oropharynx is clear.  Eyes:     Extraocular Movements: Extraocular movements intact.     Conjunctiva/sclera: Conjunctivae normal.     Pupils: Pupils are equal, round, and reactive to light.  Cardiovascular:     Rate and Rhythm: Normal rate and regular rhythm.     Pulses: Normal pulses.     Heart sounds: Normal heart sounds.  Pulmonary:     Effort: Pulmonary effort is normal.     Breath sounds: Normal breath sounds.  Musculoskeletal:        General: Normal range of motion.     Cervical back: Normal range of motion and neck supple.  Neurological:     General: No focal deficit present.     Mental Status: He is alert and oriented to person, place, and time.  Psychiatric:        Mood and Affect: Mood normal.        Behavior: Behavior normal.    ASSESSMENT AND PLAN: 1. Primary hypertension (Primary) - Continue Amlodipine  as prescribed.  - Routine screening.  - Counseled on blood pressure goal of less than 130/80, low-sodium, DASH diet, medication compliance, and 150 minutes of moderate intensity exercise per week as tolerated. Counseled on medication adherence and adverse effects. - Follow-up with primary provider in 3 months or sooner if needed. - amLODipine  (NORVASC ) 10 MG tablet; Take 1 tablet (10 mg total) by mouth daily.  Dispense: 90 tablet; Refill: 0 - Basic Metabolic Panel  2. Hyperlipidemia, unspecified hyperlipidemia type - Continue Atorvastatin  as prescribed. Counseled on medication adherence/adverse effects.  - Routine screening.  - Follow-up with primary provider as scheduled.  - atorvastatin  (LIPITOR) 20 MG tablet; Take 1 tablet (20 mg  total) by mouth daily.  Dispense: 90 tablet; Refill: 0 - Lipid panel  3. Gastroesophageal reflux disease, unspecified whether esophagitis present - Continue Omeprazole  as prescribed. Counseled on medication adherence/adverse effects.  - Follow-up with primary provider in 3 months or sooner if needed. - omeprazole  (PRILOSEC) 40 MG capsule; Take 1 capsule (40 mg total) by mouth daily.  Dispense: 90 capsule; Refill: 0  4. Spasm of back muscles - Increase Cyclobenzaprine  from 5 mg to 7.5 mg as prescribed. Counseled on medication adherence/adverse effects.  - Patient declined  referral to specialist.  - Follow-up with primary provider in 4 weeks or sooner if needed.  - cyclobenzaprine  (FEXMID ) 7.5 MG tablet; Take 1 tablet (7.5 mg total) by mouth 3 (three) times daily as needed for muscle spasms.  Dispense: 90 tablet; Refill: 0  5. Encounter for vitamin deficiency screening - Routine screening.  - Vitamin D, 25-hydroxy   Patient was given the opportunity to ask questions.  Patient verbalized understanding of the plan and was able to repeat key elements of the plan. Patient was given clear instructions to go to Emergency Department or return to medical center if symptoms don't improve, worsen, or new problems develop.The patient verbalized understanding.   Orders Placed This Encounter  Procedures   Basic Metabolic Panel   Lipid panel   Vitamin D, 25-hydroxy     Requested Prescriptions   Signed Prescriptions Disp Refills   amLODipine  (NORVASC ) 10 MG tablet 90 tablet 0    Sig: Take 1 tablet (10 mg total) by mouth daily.   atorvastatin  (LIPITOR) 20 MG tablet 90 tablet 0    Sig: Take 1 tablet (20 mg total) by mouth daily.   omeprazole  (PRILOSEC) 40 MG capsule 90 capsule 0    Sig: Take 1 capsule (40 mg total) by mouth daily.   cyclobenzaprine  (FEXMID ) 7.5 MG tablet 90 tablet 0    Sig: Take 1 tablet (7.5 mg total) by mouth 3 (three) times daily as needed for muscle spasms.    Return in  about 3 months (around 01/06/2025) for Follow-Up or next available chronic conditions.  Greig JINNY Drones, NP

## 2024-10-07 ENCOUNTER — Ambulatory Visit: Payer: Self-pay | Admitting: Family

## 2024-10-07 LAB — BASIC METABOLIC PANEL WITH GFR
BUN/Creatinine Ratio: 9 — ABNORMAL LOW (ref 10–24)
BUN: 10 mg/dL (ref 8–27)
CO2: 23 mmol/L (ref 20–29)
Calcium: 9.4 mg/dL (ref 8.6–10.2)
Chloride: 104 mmol/L (ref 96–106)
Creatinine, Ser: 1.06 mg/dL (ref 0.76–1.27)
Glucose: 95 mg/dL (ref 70–99)
Potassium: 4.1 mmol/L (ref 3.5–5.2)
Sodium: 141 mmol/L (ref 134–144)
eGFR: 80 mL/min/1.73 (ref >59)

## 2024-10-07 LAB — LIPID PANEL
Chol/HDL Ratio: 2.9 ratio (ref 0.0–5.0)
Cholesterol, Total: 137 mg/dL (ref 100–199)
HDL: 47 mg/dL (ref >39)
LDL Chol Calc (NIH): 78 mg/dL (ref 0–99)
Triglycerides: 56 mg/dL (ref 0–149)
VLDL Cholesterol Cal: 12 mg/dL (ref 5–40)

## 2024-10-07 LAB — VITAMIN D 25 HYDROXY (VIT D DEFICIENCY, FRACTURES): Vit D, 25-Hydroxy: 39.9 ng/mL (ref 30.0–100.0)

## 2025-01-06 ENCOUNTER — Encounter: Payer: Self-pay | Admitting: Family

## 2025-01-06 ENCOUNTER — Ambulatory Visit (INDEPENDENT_AMBULATORY_CARE_PROVIDER_SITE_OTHER): Admitting: Family

## 2025-01-06 VITALS — BP 124/88 | HR 82 | Ht 69.0 in | Wt 175.0 lb

## 2025-01-06 DIAGNOSIS — I10 Essential (primary) hypertension: Secondary | ICD-10-CM

## 2025-01-06 DIAGNOSIS — M62830 Muscle spasm of back: Secondary | ICD-10-CM

## 2025-01-06 DIAGNOSIS — K219 Gastro-esophageal reflux disease without esophagitis: Secondary | ICD-10-CM

## 2025-01-06 DIAGNOSIS — E785 Hyperlipidemia, unspecified: Secondary | ICD-10-CM | POA: Diagnosis not present

## 2025-01-06 MED ORDER — ATORVASTATIN CALCIUM 20 MG PO TABS: 20.0000 mg | ORAL_TABLET | Freq: Every day | ORAL | 0 refills | Status: AC

## 2025-01-06 MED ORDER — CYCLOBENZAPRINE HCL 10 MG PO TABS: 10.0000 mg | ORAL_TABLET | Freq: Three times a day (TID) | ORAL | 1 refills | Status: AC | PRN

## 2025-01-06 MED ORDER — OMEPRAZOLE 40 MG PO CPDR: 40.0000 mg | DELAYED_RELEASE_CAPSULE | Freq: Every day | ORAL | 0 refills | Status: AC

## 2025-01-06 MED ORDER — AMLODIPINE BESYLATE 10 MG PO TABS: 10.0000 mg | ORAL_TABLET | Freq: Every day | ORAL | 0 refills | Status: AC

## 2025-01-06 NOTE — Progress Notes (Signed)
 "   Patient ID: Noah Avila, male    DOB: 1961/09/20  MRN: [id number]  CC: Chronic Conditions Follow-Up  Subjective: Noah Avila is a 64 y.o. male who presents for chronic conditions follow-up.   His concerns today include:  - Doing well on Amlodipine , no issues/concerns. He does not complain of red flag symptoms such as but not limited to chest pain, shortness of breath, worst headache of life, nausea/vomiting.  - Doing well on Atorvastatin , no issues/concerns.  - Doing well on Omeprazole , no issues/concerns.  - States doesn't feel Cyclobenzaprine  helping as much as he would like. Denies red flag symptoms.  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     Medications Ordered Prior to Encounter[1]  Allergies[2]  Social History   Socioeconomic History   Marital status: Married    Spouse name: Not on file   Number of children: Not on file   Years of education: Not on file   Highest education level: Not on file  Occupational History   Not on file  Tobacco Use   Smoking status: Never    Passive exposure: Never   Smokeless tobacco: Never  Vaping Use   Vaping status: Never Used  Substance and Sexual Activity   Alcohol use: Not Currently   Drug use: No   Sexual activity: Yes  Other Topics Concern   Not on file  Social History Narrative   Not on file   Social Drivers of Health   Tobacco Use: Low Risk (01/06/2025)   Patient History    Smoking Tobacco Use: Never    Smokeless Tobacco Use: Never    Passive Exposure: Never  Financial Resource Strain: Low Risk (10/01/2023)   Overall Financial Resource Strain (CARDIA)    Difficulty of Paying Living Expenses: Not very hard  Food Insecurity: No Food Insecurity (10/01/2023)   Hunger Vital Sign    Worried About Running Out of Food in the Last  Year: Never true    Ran Out of Food in the Last Year: Never true  Transportation Needs: No Transportation Needs (10/01/2023)   PRAPARE - Administrator, Civil Service (Medical): No    Lack of Transportation (Non-Medical): No  Physical Activity: Inactive (10/01/2023)   Exercise Vital Sign    Days of Exercise per Week: 0 days    Minutes of Exercise per Session: 0 min  Stress: No Stress Concern Present (10/01/2023)   [employer] of Occupational Health - Occupational Stress Questionnaire    Feeling of Stress : Not at all  Social Connections: Moderately Isolated (10/01/2023)   Social Connection and Isolation Panel    Frequency of Communication with Friends and Family: Three times a week    Frequency of Social Gatherings with Friends and Family: Three times a week    Attends Religious Services: More than 4 times per year    Active Member of Clubs or Organizations: No    Attends Banker Meetings: Never    Marital Status: Never married  Intimate Partner Violence: Not At Risk (10/01/2023)   Humiliation, Afraid, Rape, and Kick questionnaire    Fear of Current or Ex-Partner: No    Emotionally Abused: No    Physically Abused: No    Sexually Abused: No  Depression (PHQ2-9): Low Risk (10/06/2024)   Depression (PHQ2-9)  PHQ-2 Score: 0  Alcohol Screen: Low Risk (10/01/2023)   Alcohol Screen    Last Alcohol Screening Score (AUDIT): 0  Housing: Low Risk (10/01/2023)   Housing    Last Housing Risk Score: 0  Utilities: Not At Risk (10/01/2023)   [facility] Utilities    Threatened with loss of utilities: No  Health Literacy: Adequate Health Literacy (10/01/2023)   B1300 Health Literacy    Frequency of need for help with medical instructions: Never    Family History  Problem Relation Age of Onset   Coronary artery disease Mother    Lung cancer Mother    Renal Disease Father    Diabetes Father    Heart disease Father    Heart disease Brother     Past Surgical History:   Procedure Laterality Date   NO PAST SURGERIES      ROS: Review of Systems Negative except as stated above  PHYSICAL EXAM: BP 124/88   Pulse 82   Ht 5' 9 (1.753 m)   Wt 175 lb (79.4 kg)   SpO2 96%   BMI 25.84 kg/m   Physical Exam HENT:     Head: Normocephalic and atraumatic.     Nose: Nose normal.     Mouth/Throat:     Mouth: Mucous membranes are moist.     Pharynx: Oropharynx is clear.  Eyes:     Extraocular Movements: Extraocular movements intact.     Conjunctiva/sclera: Conjunctivae normal.     Pupils: Pupils are equal, round, and reactive to light.  Cardiovascular:     Rate and Rhythm: Normal rate and regular rhythm.     Pulses: Normal pulses.     Heart sounds: Normal heart sounds.  Pulmonary:     Effort: Pulmonary effort is normal.     Breath sounds: Normal breath sounds.  Musculoskeletal:        General: Normal range of motion.     Cervical back: Normal range of motion and neck supple.  Neurological:     General: No focal deficit present.     Mental Status: He is alert and oriented to person, place, and time.  Psychiatric:        Mood and Affect: Mood normal.        Behavior: Behavior normal.    ASSESSMENT AND PLAN: 1. Primary hypertension (Primary) - Continue Amlodipine  as prescribed. - Counseled on blood pressure goal of less than 130/80, low-sodium, DASH diet, medication compliance, and 150 minutes of moderate intensity exercise per week as tolerated. Counseled on medication adherence and adverse effects. - Follow-up with primary provider in 3 months or sooner if needed.  - amLODipine  (NORVASC ) 10 MG tablet; Take 1 tablet (10 mg total) by mouth daily.  Dispense: 90 tablet; Refill: 0  2. Hyperlipidemia, unspecified hyperlipidemia type - Continue Atorvastatin  as prescribed. Counseled on medication adherence/adverse effects.  - Follow-up with primary provider in 3 months or sooner if needed. - atorvastatin  (LIPITOR) 20 MG tablet; Take 1 tablet (20 mg  total) by mouth daily.  Dispense: 90 tablet; Refill: 0  3. Spasm of back muscles - Increase Cyclobenzaprine  from 7.5 mg to 10 mg as prescribed. Counseled on medication adherence/adverse effects.  - Follow-up with primary provider in 4 weeks or sooner if needed. - cyclobenzaprine  (FLEXERIL ) 10 MG tablet; Take 1 tablet (10 mg total) by mouth 3 (three) times daily as needed for muscle spasms.  Dispense: 90 tablet; Refill: 1  4. Gastroesophageal reflux disease, unspecified whether esophagitis present - Continue Omeprazole   as prescribed. Counseled on medication adherence/adverse effects.  - Follow-up with primary provider in 3 months or sooner if needed. - omeprazole  (PRILOSEC) 40 MG capsule; Take 1 capsule (40 mg total) by mouth daily.  Dispense: 90 capsule; Refill: 0  Patient was given the opportunity to ask questions.  Patient verbalized understanding of the plan and was able to repeat key elements of the plan. Patient was given clear instructions to go to Emergency Department or return to medical center if symptoms don't improve, worsen, or new problems develop.The patient verbalized understanding.   Requested Prescriptions   Signed Prescriptions Disp Refills   amLODipine  (NORVASC ) 10 MG tablet 90 tablet 0    Sig: Take 1 tablet (10 mg total) by mouth daily.   atorvastatin  (LIPITOR) 20 MG tablet 90 tablet 0    Sig: Take 1 tablet (20 mg total) by mouth daily.   cyclobenzaprine  (FLEXERIL ) 10 MG tablet 90 tablet 1    Sig: Take 1 tablet (10 mg total) by mouth 3 (three) times daily as needed for muscle spasms.   omeprazole  (PRILOSEC) 40 MG capsule 90 capsule 0    Sig: Take 1 capsule (40 mg total) by mouth daily.    Return in about 3 months (around 04/06/2025) for Follow-Up or next available chronic conditions.  Greig JINNY Chute, NP      [1]  Current Outpatient Medications on File Prior to Visit  Medication Sig Dispense Refill   dicyclomine  (BENTYL ) 20 MG tablet Take 1 tablet (20 mg total) by  mouth 2 (two) times daily. 20 tablet 0   ondansetron  (ZOFRAN -ODT) 4 MG disintegrating tablet Take 1 tablet (4 mg total) by mouth every 8 (eight) hours as needed for nausea or vomiting. 20 tablet 0   No current facility-administered medications on file prior to visit.  [2] No Known Allergies  "

## 2025-04-06 ENCOUNTER — Ambulatory Visit: Payer: Self-pay | Admitting: Family
